# Patient Record
Sex: Female | Born: 1995 | Race: White | Hispanic: Yes | Marital: Single | Smoking: Former smoker
Health system: Southern US, Community
[De-identification: ages and names within clinical notes are randomized; demographics above are authoritative.]

## PROBLEM LIST (undated history)

## (undated) DIAGNOSIS — O24419 Gestational diabetes mellitus in pregnancy, unspecified control: Secondary | ICD-10-CM

## (undated) DIAGNOSIS — Z789 Other specified health status: Secondary | ICD-10-CM

## (undated) DIAGNOSIS — S82842A Displaced bimalleolar fracture of left lower leg, initial encounter for closed fracture: Secondary | ICD-10-CM

## (undated) HISTORY — PX: OTHER SURGICAL HISTORY: SHX169

## (undated) HISTORY — DX: Gestational diabetes mellitus in pregnancy, unspecified control: O24.419

## (undated) HISTORY — PX: ANKLE SURGERY: SHX546

---
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
## 2016-10-01 ENCOUNTER — Ambulatory Visit: Payer: Self-pay | Admitting: Sports Medicine

## 2016-10-17 ENCOUNTER — Ambulatory Visit: Payer: Self-pay | Admitting: Sports Medicine

## 2016-12-03 ENCOUNTER — Ambulatory Visit: Payer: Self-pay | Admitting: Sports Medicine

## 2016-12-11 ENCOUNTER — Ambulatory Visit: Payer: Self-pay | Admitting: Sports Medicine

## 2016-12-22 ENCOUNTER — Ambulatory Visit: Payer: Self-pay | Admitting: Podiatry

## 2017-09-04 ENCOUNTER — Encounter (HOSPITAL_COMMUNITY): Payer: Self-pay | Admitting: Emergency Medicine

## 2017-09-04 ENCOUNTER — Other Ambulatory Visit: Payer: Self-pay

## 2017-09-04 ENCOUNTER — Ambulatory Visit (HOSPITAL_COMMUNITY)
Admission: EM | Admit: 2017-09-04 | Discharge: 2017-09-05 | Disposition: A | Discharge status: Home / Self Care | Payer: Self-pay | Attending: Family Medicine | Admitting: Family Medicine

## 2017-09-04 DIAGNOSIS — O00101 Right tubal pregnancy without intrauterine pregnancy: Secondary | ICD-10-CM | POA: Insufficient documentation

## 2017-09-04 DIAGNOSIS — O26891 Other specified pregnancy related conditions, first trimester: Secondary | ICD-10-CM

## 2017-09-04 DIAGNOSIS — R109 Unspecified abdominal pain: Secondary | ICD-10-CM

## 2017-09-04 DIAGNOSIS — Z87891 Personal history of nicotine dependence: Secondary | ICD-10-CM | POA: Insufficient documentation

## 2017-09-04 HISTORY — DX: Other specified health status: Z78.9

## 2017-09-04 LAB — CBC
HCT: 38.6 % (ref 36.0–46.0)
Hemoglobin: 13 g/dL (ref 12.0–15.0)
MCH: 32.9 pg (ref 26.0–34.0)
MCHC: 33.7 g/dL (ref 30.0–36.0)
MCV: 97.7 fL (ref 78.0–100.0)
PLATELETS: 296 10*3/uL (ref 150–400)
RBC: 3.95 MIL/uL (ref 3.87–5.11)
RDW: 12.4 % (ref 11.5–15.5)
WBC: 6.7 10*3/uL (ref 4.0–10.5)

## 2017-09-04 LAB — URINALYSIS, ROUTINE W REFLEX MICROSCOPIC
Bilirubin Urine: NEGATIVE
GLUCOSE, UA: NEGATIVE mg/dL
KETONES UR: NEGATIVE mg/dL
Leukocytes, UA: NEGATIVE
Nitrite: NEGATIVE
PH: 8 (ref 5.0–8.0)
Protein, ur: NEGATIVE mg/dL
Specific Gravity, Urine: 1.014 (ref 1.005–1.030)

## 2017-09-04 LAB — COMPREHENSIVE METABOLIC PANEL
ALK PHOS: 63 U/L (ref 38–126)
ALT: 26 U/L (ref 14–54)
AST: 27 U/L (ref 15–41)
Albumin: 4.2 g/dL (ref 3.5–5.0)
Anion gap: 8 (ref 5–15)
BUN: 7 mg/dL (ref 6–20)
CALCIUM: 9 mg/dL (ref 8.9–10.3)
CHLORIDE: 105 mmol/L (ref 101–111)
CO2: 27 mmol/L (ref 22–32)
CREATININE: 0.59 mg/dL (ref 0.44–1.00)
Glucose, Bld: 107 mg/dL — ABNORMAL HIGH (ref 65–99)
Potassium: 3.3 mmol/L — ABNORMAL LOW (ref 3.5–5.1)
Sodium: 140 mmol/L (ref 135–145)
Total Bilirubin: 0.8 mg/dL (ref 0.3–1.2)
Total Protein: 6.8 g/dL (ref 6.5–8.1)

## 2017-09-04 LAB — I-STAT BETA HCG BLOOD, ED (MC, WL, AP ONLY): I-stat hCG, quantitative: 70.2 m[IU]/mL — ABNORMAL HIGH (ref <5)

## 2017-09-04 LAB — LIPASE, BLOOD: LIPASE: 27 U/L (ref 11–51)

## 2017-09-04 NOTE — ED Triage Notes (Signed)
C/o RLQ/pelvic pain/cramping since yesterday.  LMP started 5/1 and she is still spotting.  Normally last 4 days.  Mild nausea this evening.  Denies urinary complaints.

## 2017-09-05 ENCOUNTER — Encounter (HOSPITAL_COMMUNITY)
Admission: EM | Disposition: A | Discharge status: Home / Self Care | Payer: Self-pay | Source: Home / Self Care | Attending: Emergency Medicine

## 2017-09-05 ENCOUNTER — Encounter (HOSPITAL_COMMUNITY): Payer: Self-pay | Admitting: Student

## 2017-09-05 ENCOUNTER — Emergency Department (HOSPITAL_COMMUNITY): Payer: Self-pay

## 2017-09-05 ENCOUNTER — Inpatient Hospital Stay (HOSPITAL_COMMUNITY): Payer: Self-pay | Admitting: Anesthesiology

## 2017-09-05 ENCOUNTER — Inpatient Hospital Stay (HOSPITAL_COMMUNITY): Payer: Self-pay

## 2017-09-05 DIAGNOSIS — O00101 Right tubal pregnancy without intrauterine pregnancy: Secondary | ICD-10-CM | POA: Diagnosis present

## 2017-09-05 HISTORY — PX: LAPAROSCOPY: SHX197

## 2017-09-05 LAB — CBC
HEMATOCRIT: 39.9 % (ref 36.0–46.0)
HEMOGLOBIN: 13.8 g/dL (ref 12.0–15.0)
MCH: 33.8 pg (ref 26.0–34.0)
MCHC: 34.6 g/dL (ref 30.0–36.0)
MCV: 97.8 fL (ref 78.0–100.0)
Platelets: 292 10*3/uL (ref 150–400)
RBC: 4.08 MIL/uL (ref 3.87–5.11)
RDW: 12.2 % (ref 11.5–15.5)
WBC: 5.7 10*3/uL (ref 4.0–10.5)

## 2017-09-05 LAB — ABO/RH
ABO/RH(D): O POS
ABO/RH(D): O POS

## 2017-09-05 LAB — TYPE AND SCREEN
ABO/RH(D): O POS
ANTIBODY SCREEN: NEGATIVE

## 2017-09-05 LAB — WET PREP, GENITAL
SPERM: NONE SEEN
Trich, Wet Prep: NONE SEEN
YEAST WET PREP: NONE SEEN

## 2017-09-05 LAB — HCG, QUANTITATIVE, PREGNANCY
hCG, Beta Chain, Quant, S: 71 m[IU]/mL — ABNORMAL HIGH (ref <5)
hCG, Beta Chain, Quant, S: 66 m[IU]/mL — ABNORMAL HIGH (ref <5)

## 2017-09-05 SURGERY — LAPAROSCOPY OPERATIVE
Anesthesia: General | Site: Abdomen | Laterality: Right

## 2017-09-05 MED ORDER — LIDOCAINE HCL (CARDIAC) PF 100 MG/5ML IV SOSY
PREFILLED_SYRINGE | INTRAVENOUS | Status: AC
  Filled 2017-09-05: qty 5

## 2017-09-05 MED ORDER — MEPERIDINE HCL 25 MG/ML IJ SOLN
INTRAMUSCULAR | Status: AC
  Administered 2017-09-05: 12.5 mg via INTRAVENOUS
  Filled 2017-09-05: qty 1

## 2017-09-05 MED ORDER — LACTATED RINGERS IV BOLUS
1000.0000 mL | Freq: Once | INTRAVENOUS | Status: AC
Start: 2017-09-05 — End: 2017-09-05
  Administered 2017-09-05 (×2): 1000 mL via INTRAVENOUS

## 2017-09-05 MED ORDER — DEXAMETHASONE SODIUM PHOSPHATE 10 MG/ML IJ SOLN
INTRAMUSCULAR | Status: DC | PRN
Start: 2017-09-05 — End: 2017-09-05
  Administered 2017-09-05: 10 mg via INTRAVENOUS

## 2017-09-05 MED ORDER — SUCCINYLCHOLINE CHLORIDE 200 MG/10ML IV SOSY
PREFILLED_SYRINGE | INTRAVENOUS | Status: AC
  Filled 2017-09-05: qty 10

## 2017-09-05 MED ORDER — BUPIVACAINE HCL (PF) 0.25 % IJ SOLN
INTRAMUSCULAR | Status: DC | PRN
  Administered 2017-09-05: 30 mL

## 2017-09-05 MED ORDER — ROCURONIUM BROMIDE 100 MG/10ML IV SOLN
INTRAVENOUS | Status: AC
  Filled 2017-09-05: qty 1

## 2017-09-05 MED ORDER — MEPERIDINE HCL 25 MG/ML IJ SOLN
6.2500 mg | INTRAMUSCULAR | Status: DC | PRN
  Administered 2017-09-05 (×2): 12.5 mg via INTRAVENOUS

## 2017-09-05 MED ORDER — DEXAMETHASONE SODIUM PHOSPHATE 10 MG/ML IJ SOLN
INTRAMUSCULAR | Status: AC
  Filled 2017-09-05: qty 1

## 2017-09-05 MED ORDER — LACTATED RINGERS IV SOLN: INTRAVENOUS | Status: DC

## 2017-09-05 MED ORDER — SUGAMMADEX SODIUM 200 MG/2ML IV SOLN
INTRAVENOUS | Status: AC
  Filled 2017-09-05: qty 2

## 2017-09-05 MED ORDER — EPHEDRINE 5 MG/ML INJ
INTRAVENOUS | Status: AC
  Filled 2017-09-05: qty 10

## 2017-09-05 MED ORDER — PROPOFOL 10 MG/ML IV BOLUS
INTRAVENOUS | Status: AC
  Filled 2017-09-05: qty 20

## 2017-09-05 MED ORDER — KETOROLAC TROMETHAMINE 30 MG/ML IJ SOLN
INTRAMUSCULAR | Status: AC
  Filled 2017-09-05: qty 1

## 2017-09-05 MED ORDER — MIDAZOLAM HCL 5 MG/5ML IJ SOLN
INTRAMUSCULAR | Status: DC | PRN
  Administered 2017-09-05: 2 mg via INTRAVENOUS

## 2017-09-05 MED ORDER — ONDANSETRON HCL 4 MG/2ML IJ SOLN
INTRAMUSCULAR | Status: AC
  Filled 2017-09-05: qty 2

## 2017-09-05 MED ORDER — ROCURONIUM BROMIDE 10 MG/ML (PF) SYRINGE
PREFILLED_SYRINGE | INTRAVENOUS | Status: DC | PRN
  Administered 2017-09-05: 80 mg via INTRAVENOUS

## 2017-09-05 MED ORDER — SODIUM CHLORIDE 0.9 % IR SOLN
Status: DC | PRN
  Administered 2017-09-05: 3000 mL

## 2017-09-05 MED ORDER — OXYCODONE-ACETAMINOPHEN 5-325 MG PO TABS: 1.0000 | ORAL_TABLET | Freq: Four times a day (QID) | ORAL | 0 refills | Status: DC | PRN

## 2017-09-05 MED ORDER — LIDOCAINE 2% (20 MG/ML) 5 ML SYRINGE
INTRAMUSCULAR | Status: DC | PRN
  Administered 2017-09-05: 100 mg via INTRAVENOUS

## 2017-09-05 MED ORDER — FENTANYL CITRATE (PF) 100 MCG/2ML IJ SOLN
25.0000 ug | INTRAMUSCULAR | Status: DC | PRN
  Administered 2017-09-05: 50 ug via INTRAVENOUS

## 2017-09-05 MED ORDER — PROPOFOL 10 MG/ML IV BOLUS
INTRAVENOUS | Status: DC | PRN
  Administered 2017-09-05: 160 mg via INTRAVENOUS

## 2017-09-05 MED ORDER — METOCLOPRAMIDE HCL 5 MG/ML IJ SOLN: 10.0000 mg | Freq: Once | INTRAMUSCULAR | Status: DC | PRN

## 2017-09-05 MED ORDER — PROPOFOL 10 MG/ML IV BOLUS
INTRAVENOUS | Status: AC
  Filled 2017-09-05: qty 40

## 2017-09-05 MED ORDER — OXYCODONE HCL 5 MG PO TABS: 5.0000 mg | ORAL_TABLET | Freq: Four times a day (QID) | ORAL | 0 refills | Status: DC | PRN

## 2017-09-05 MED ORDER — FENTANYL CITRATE (PF) 100 MCG/2ML IJ SOLN
INTRAMUSCULAR | Status: AC
  Administered 2017-09-05: 50 ug via INTRAVENOUS
  Filled 2017-09-05: qty 2

## 2017-09-05 MED ORDER — BUPIVACAINE HCL (PF) 0.25 % IJ SOLN
INTRAMUSCULAR | Status: AC
  Filled 2017-09-05: qty 30

## 2017-09-05 MED ORDER — ONDANSETRON HCL 4 MG/2ML IJ SOLN
INTRAMUSCULAR | Status: DC | PRN
  Administered 2017-09-05: 4 mg via INTRAVENOUS

## 2017-09-05 MED ORDER — KETOROLAC TROMETHAMINE 30 MG/ML IJ SOLN
INTRAMUSCULAR | Status: DC | PRN
  Administered 2017-09-05: 30 mg via INTRAVENOUS

## 2017-09-05 MED ORDER — LACTATED RINGERS IV SOLN
INTRAVENOUS | Status: DC | PRN
  Administered 2017-09-05: 18:00:00 via INTRAVENOUS

## 2017-09-05 MED ORDER — FENTANYL CITRATE (PF) 250 MCG/5ML IJ SOLN
INTRAMUSCULAR | Status: AC
  Filled 2017-09-05: qty 5

## 2017-09-05 MED ORDER — MIDAZOLAM HCL 2 MG/2ML IJ SOLN
INTRAMUSCULAR | Status: AC
  Filled 2017-09-05: qty 2

## 2017-09-05 MED ORDER — FENTANYL CITRATE (PF) 250 MCG/5ML IJ SOLN
INTRAMUSCULAR | Status: DC | PRN
  Administered 2017-09-05: 100 ug via INTRAVENOUS
  Administered 2017-09-05 (×2): 50 ug via INTRAVENOUS

## 2017-09-05 MED ORDER — SUGAMMADEX SODIUM 200 MG/2ML IV SOLN
INTRAVENOUS | Status: DC | PRN
  Administered 2017-09-05: 200 mg via INTRAVENOUS

## 2017-09-05 SURGICAL SUPPLY — 23 items
BLADE SURG 15 STRL LF C SS BP (BLADE) IMPLANT
BLADE SURG 15 STRL SS (BLADE)
DRSG OPSITE POSTOP 3X4 (GAUZE/BANDAGES/DRESSINGS) ×3 IMPLANT
DURAPREP 26ML APPLICATOR (WOUND CARE) ×3 IMPLANT
GLOVE BIOGEL PI IND STRL 7.0 (GLOVE) ×4 IMPLANT
GLOVE BIOGEL PI INDICATOR 7.0 (GLOVE) ×8
GLOVE ECLIPSE 7.0 STRL STRAW (GLOVE) ×3 IMPLANT
GOWN STRL REUS W/TWL LRG LVL3 (GOWN DISPOSABLE) ×9 IMPLANT
PACK LAPAROSCOPY BASIN (CUSTOM PROCEDURE TRAY) ×3 IMPLANT
PACK TRENDGUARD 450 HYBRID PRO (MISCELLANEOUS) IMPLANT
POUCH SPECIMEN RETRIEVAL 10MM (ENDOMECHANICALS) ×3 IMPLANT
PROTECTOR NERVE ULNAR (MISCELLANEOUS) ×6 IMPLANT
SET IRRIG TUBING LAPAROSCOPIC (IRRIGATION / IRRIGATOR) ×3 IMPLANT
SHEARS HARMONIC ACE PLUS 36CM (ENDOMECHANICALS) ×3 IMPLANT
SLEEVE XCEL OPT CAN 5 100 (ENDOMECHANICALS) ×3 IMPLANT
SUT VIC AB 3-0 X1 27 (SUTURE) ×3 IMPLANT
SUT VICRYL 0 UR6 27IN ABS (SUTURE) ×3 IMPLANT
TOWEL OR 17X24 6PK STRL BLUE (TOWEL DISPOSABLE) ×6 IMPLANT
TRAY FOLEY W/BAG SLVR 14FR (SET/KITS/TRAYS/PACK) ×3 IMPLANT
TRENDGUARD 450 HYBRID PRO PACK (MISCELLANEOUS)
TROCAR BALLN 12MMX100 BLUNT (TROCAR) ×3 IMPLANT
TROCAR XCEL NON-BLD 5MMX100MML (ENDOMECHANICALS) ×3 IMPLANT
WARMER LAPAROSCOPE (MISCELLANEOUS) ×3 IMPLANT

## 2017-09-05 NOTE — MAU Provider Note (Signed)
History     CSN: [id number]  Arrival date and time: 09/04/17 2132   First Provider Initiated Contact with Patient 09/05/17 1246      Chief Complaint  Patient presents with  . Abdominal Pain   HPI Leah Gray is a 22 y.o. G1P0 at [redacted]w[redacted]d who presents from [facility] for evaluation of possible ectopic pregnancy. This is her first pregnancy. Presented to the ED last night for RLQ pain. Pain started early Friday morning. Pain initially rated 9/10 & described as intermittent cramping. Currently denies any pain. Denies n/v/d, dysuria, vaginal discharge, or vaginal bleeding.   OB History    Gravida  1   Para      Term      Preterm      AB      Living        SAB      TAB      Ectopic      Multiple      Live Births              Past Medical History:  Diagnosis Date  . Medical history non-contributory     Past Surgical History:  Procedure Laterality Date  . ANKLE FRACTURE SURGERY  2014   bilateral   . ANKLE SURGERY      Family History  Problem Relation Age of Onset  . Asthma Mother   . Cancer Mother   . Miscarriages / Stillbirths Maternal Aunt   . COPD Maternal Grandmother   . Anxiety disorder Maternal Grandfather   . Depression Maternal Grandfather   . Diabetes Maternal Grandfather     Social History   Tobacco Use  . Smoking status: Former Games developer  . Smokeless tobacco: Never Used  Substance Use Topics  . Alcohol use: Yes    Comment: social   . Drug use: Never    Allergies: No Known Allergies  No medications prior to admission.    Review of Systems  Constitutional: Negative.   Gastrointestinal: Positive for abdominal pain.  Genitourinary: Negative.    Physical Exam   Blood pressure 118/77, pulse 66, temperature 98.8 F (37.1 C), temperature source Oral, resp. rate 16, height  (1.549 m), weight 140 lb (63.5 kg), last menstrual period 08/26/2017, SpO2 100 %.  Physical Exam  Nursing note and vitals reviewed. Constitutional: She is  oriented to person, place, and time. She appears well-developed and well-nourished. No distress.  HENT:  Head: Normocephalic and atraumatic.  Eyes: Conjunctivae are normal. Right eye exhibits no discharge. Left eye exhibits no discharge. No scleral icterus.  Neck: Normal range of motion.  Cardiovascular: Normal rate, regular rhythm and normal heart sounds.  No murmur heard. Respiratory: Effort normal and breath sounds normal. No respiratory distress. She has no wheezes.  GI: Soft. Bowel sounds are normal. She exhibits no distension. There is no tenderness. There is no rebound and no guarding.  Neurological: She is alert and oriented to person, place, and time.  Skin: Skin is warm and dry. She is not diaphoretic.  Psychiatric: She has a normal mood and affect. Her behavior is normal. Judgment and thought content normal.    MAU Course  Procedures Results for orders placed or performed during the hospital encounter of 09/04/17 (from the past 24 hour(s))  Urinalysis, Routine w reflex microscopic     Status: Abnormal   Collection Time: 09/04/17 10:20 PM  Result Value Ref Range   Color, Urine YELLOW YELLOW   APPearance HAZY (A) CLEAR  Specific Gravity, Urine 1.014 1.005 - 1.030   pH 8.0 5.0 - 8.0   Glucose, UA NEGATIVE NEGATIVE mg/dL   Hgb urine dipstick LARGE (A) NEGATIVE   Bilirubin Urine NEGATIVE NEGATIVE   Ketones, ur NEGATIVE NEGATIVE mg/dL   Protein, ur NEGATIVE NEGATIVE mg/dL   Nitrite NEGATIVE NEGATIVE   Leukocytes, UA NEGATIVE NEGATIVE   RBC / HPF 11-20 0 - 5 RBC/hpf   WBC, UA 0-5 0 - 5 WBC/hpf   Bacteria, UA FEW (A) NONE SEEN   Squamous Epithelial / LPF 0-5 0 - 5  Lipase, blood     Status: None   Collection Time: 09/04/17 10:27 PM  Result Value Ref Range   Lipase 27 11 - 51 U/L  Comprehensive metabolic panel     Status: Abnormal   Collection Time: 09/04/17 10:27 PM  Result Value Ref Range   Sodium 140 135 - 145 mmol/L   Potassium 3.3 (L) 3.5 - 5.1 mmol/L   Chloride  105 101 - 111 mmol/L   CO2 27 22 - 32 mmol/L   Glucose, Bld 107 (H) 65 - 99 mg/dL   BUN 7 6 - 20 mg/dL   Creatinine, Ser 5.40 0.44 - 1.00 mg/dL   Calcium 9.0 8.9 - 98.1 mg/dL   Total Protein 6.8 6.5 - 8.1 g/dL   Albumin 4.2 3.5 - 5.0 g/dL   AST 27 15 - 41 U/L   ALT 26 14 - 54 U/L   Alkaline Phosphatase 63 38 - 126 U/L   Total Bilirubin 0.8 0.3 - 1.2 mg/dL   GFR calc non Af Amer >60 >60 mL/min   GFR calc Af Amer >60 >60 mL/min   Anion gap 8 5 - 15  CBC     Status: None   Collection Time: 09/04/17 10:27 PM  Result Value Ref Range   WBC 6.7 4.0 - 10.5 K/uL   RBC 3.95 3.87 - 5.11 MIL/uL   Hemoglobin 13.0 12.0 - 15.0 g/dL   HCT 19.1 47.8 - 29.5 %   MCV 97.7 78.0 - 100.0 fL   MCH 32.9 26.0 - 34.0 pg   MCHC 33.7 30.0 - 36.0 g/dL   RDW 62.1 30.8 - 65.7 %   Platelets 296 150 - 400 K/uL  I-Stat beta hCG blood, ED     Status: Abnormal   Collection Time: 09/04/17 10:48 PM  Result Value Ref Range   I-stat hCG, quantitative 70.2 (H) <5 mIU/mL   Comment 3          Wet prep, genital     Status: Abnormal   Collection Time: 09/05/17  7:00 AM  Result Value Ref Range   Yeast Wet Prep HPF POC NONE SEEN NONE SEEN   Trich, Wet Prep NONE SEEN NONE SEEN   Clue Cells Wet Prep HPF POC PRESENT (A) NONE SEEN   WBC, Wet Prep HPF POC FEW (A) NONE SEEN   Sperm NONE SEEN   hCG, quantitative, pregnancy     Status: Abnormal   Collection Time: 09/05/17  7:25 AM  Result Value Ref Range   hCG, Beta Chain, Quant, S 71 (H) <5 mIU/mL  ABO/Rh     Status: None   Collection Time: 09/05/17  7:29 AM  Result Value Ref Range   ABO/RH(D) O POS    No rh immune globuloin      NOT A RH IMMUNE GLOBULIN CANDIDATE, PT RH POSITIVE Performed at [facility], [address], [place], [place] [address]  CBC     Status: None   Collection Time: 09/05/17  1:08 PM  Result Value Ref Range   WBC 5.7 4.0 - 10.5 K/uL   RBC 4.08 3.87 - 5.11 MIL/uL   Hemoglobin 13.8 12.0 - 15.0 g/dL   HCT 16.1 09.6 - 04.5 %   MCV 97.8  78.0 - 100.0 fL   MCH 33.8 26.0 - 34.0 pg   MCHC 34.6 30.0 - 36.0 g/dL   RDW 40.9 81.1 - 91.4 %   Platelets 292 150 - 400 K/uL  hCG, quantitative, pregnancy     Status: Abnormal   Collection Time: 09/05/17  1:08 PM  Result Value Ref Range   hCG, Beta Chain, Quant, S 66 (H) <5 mIU/mL  Type and screen     Status: None (Preliminary result)   Collection Time: 09/05/17  1:16 PM  Result Value Ref Range   ABO/RH(D) O POS    Antibody Screen PENDING    Sample Expiration      09/08/2017 Performed at [facility], [address], [place], [place] [address]    US Ob Transvaginal  Result Date: 09/05/2017 CLINICAL DATA:  Pain in first trimester of pregnancy, prior abnormal ultrasound with RIGHT ectopic pregnancy EXAM: TRANSVAGINAL OB ULTRASOUND TECHNIQUE: Transvaginal ultrasound was performed for complete evaluation of the gestation as well as the maternal uterus, adnexal regions, and pelvic cul-de-sac. COMPARISON:  Earlier study of 09/05/2017 FINDINGS: Intrauterine gestational sac: Absent Yolk sac:  N/A Embryo:  N/A Cardiac Activity: N/A Heart Rate: N/A bpm MSD:   mm    w     d CRL:     mm    w  d                  [place] EDC: Subchorionic hemorrhage:  N/A Maternal uterus/adnexae: Uterus normal appearance. No uterine mass, gestational sac, or endometrial fluid. Moderate complicated free pelvic fluid question blood. Ovaries remain normal in size and morphology. Adjacent to the RIGHT ovary but appearing separate is a small rounded heterogeneous nodule measuring 2.3 x 1.7 x 2.0 cm. Nodule demonstrates hypervascularity on color Doppler imaging. Findings remain most consistent with a RIGHT adnexal ectopic pregnancy. IMPRESSION: No intrauterine gestational sac identified. RIGHT adnexal mass 2.3 cm greatest diameter with associated moderate complex fluid/blood in pelvis consistent with a ruptured RIGHT adnexal ectopic pregnancy. Critical Value/emergent results were called by telephone at the time of interpretation  on 09/05/2017 at 1:55 pm to Judeth Horn NP, who verbally acknowledged these results. Electronically Signed   By: Ulyses Southward M.D.   On: 09/05/2017 13:56    MDM Labs & ultrasound repeated here in MAU. Ultrasound shows 2.3 cm right adnexal mass with moderate complex fluid consistent with ruptured ectopic.   Dr. Shawnie Pons notified of results and will come see patient & discuss plan of care.   Assessment and Plan  A:  1. Right tubal pregnancy without intrauterine pregnancy   2. Abdominal pain during pregnancy in first trimester    P: Dr. Shawnie Pons at bedside  Judeth Horn 09/05/2017, 12:46 PM

## 2017-09-05 NOTE — Anesthesia Procedure Notes (Signed)
Procedure Name: Intubation Date/Time: 09/05/2017 5:46 PM Performed by: Jhonnie Garner, CRNA Pre-anesthesia Checklist: Patient identified, Emergency Drugs available, Suction available and Patient being monitored Patient Re-evaluated:Patient Re-evaluated prior to induction Oxygen Delivery Method: Circle system utilized Preoxygenation: Pre-oxygenation with 100% oxygen Induction Type: IV induction Ventilation: Mask ventilation without difficulty Laryngoscope Size: Miller and 2 Grade View: Grade I Tube type: Oral Tube size: 7.0 mm Number of attempts: 1 Airway Equipment and Method: Stylet Placement Confirmation: ETT inserted through vocal cords under direct vision,  positive ETCO2 and breath sounds checked- equal and bilateral Secured at: 21 cm Tube secured with: Tape Dental Injury: Teeth and Oropharynx as per pre-operative assessment

## 2017-09-05 NOTE — Op Note (Signed)
PREOPERATIVE DIAGNOSIS: Probable ruptured ectopic pregnancy  POSTOPERATIVE DIAGNOSIS: Same  PROCEDURE: Laparoscopic right salpingectomy   INDICATIONS: 22 y.o. G1P0 at [redacted]w[redacted]d here for with ruptured ectopic pregnancy with blood type O pos. Patient was counseled regarding need for laparoscopic salpingectomy. Risks of surgery including bleeding which may require transfusion or reoperation, infection, injury to bowel or other surrounding organs, need for additional procedures including laparotomy and other postoperative/anesthesia complications were explained to patient.  Written informed consent was obtained  FINDINGS: minimal amount of hemoperitoneum estimated to be about 100 cc of blood and clots.  Dilated right fallopian tube containing ectopic gestation. Small normal appearing uterus, normal right fallopian tube, right ovary and right ovary.  ANESTHESIA: General  SPECIMENS: right fallopian tube to pathology  COMPLICATIONS: None immediate  PROCEDURE IN DETAIL:  The patient was taken to the operating room where general anesthesia was administered and was found to be adequate.  She was placed in the dorsal lithotomy position, and was prepped and draped in a sterile manner.  A Foley catheter was inserted into her bladder and attached to constant drainage and a uterine manipulator was then advanced into the uterus .  After an adequate timeout was performed, attention was then turned to the patient's abdomen where a 10-mm skin incision was made in the umbilical fold. Fascia and peritoneum were entered sharply.  A 0 Vicryl suture was used to tag the fascia circumferentially.  A Hassan trocar was placed. The laparoscope was introduced.  A survey of the patient's pelvis and abdomen revealed the findings as above.  Two left lower quadrant ports were placed under direct visualization; 5-mm x 2.  Attention was then turned to the right fallopian tube which was grasped and ligated from the underlying mesosalpinx and  uterine attachment using the Harmonic instrument.  Good hemostasis was noted.  The specimen was placed in an EndoCatch bag and removed from the abdomen intact. Clot and blood removed with the Nezjhat. The abdomen was desufflated, and all instruments were removed.  The umbilicus incision was closed with the afore mentioned Vicryl suture; and all skin incisions were closed with a 3-0 Vicryl subcuticular stitch followed by DermaBond. The patient tolerated the procedure well.  All instruments, needles, and sponge counts were correct x 2. The patient was taken to the recovery room in stable condition.   Reva Bores MD 09/05/2017 7:00 PM

## 2017-09-05 NOTE — ED Provider Notes (Addendum)
[facility] EMERGENCY DEPARTMENT Provider Note   CSN: [id number] Arrival date & time: 09/04/17  2132     History   Chief Complaint Chief Complaint  Patient presents with  . Abdominal Pain    HPI Leah Gray is a 22 y.o. female without significant past medical hx who presents to the ED with complaint of RLQ/R sided pelvic pain that started yesterday around 0200 (approx. 30 hours ago). Patient states she started her menstrual cycle 05/01 during which she had generalized intermittent pelvic cramps that she has with her typical periods. She states that she had what was fairly normal bleeding for her for a few days that had then eased off, but never completely stopped. Two days ago the bleeding started to increase again requiring approximately 5 tampons per day. She states she developed the R lower pelvic discomfort fairly suddenly at 0200 in the AM yesterday, the pain has been constant since, but has significant eased off. She rates her current discomfort a 3/10 in severity. No specific alleviating/aggravating factors. She had some associated nausea without vomiting. States she had some dyspareunia 2 days ago as well. Denies fever, chills, vomiting, diarrhea, blood in stool, dysuria, or vaginal discharge. She is sexually active in a monogamous relationship without any form of protection. She is G0P0A0.    HPI  History reviewed. No pertinent past medical history.  There are no active problems to display for this patient.   Past Surgical History:  Procedure Laterality Date  . ANKLE SURGERY       OB History   None      Home Medications    Prior to Admission medications   Not on File    Family History History reviewed. No pertinent family history.  Social History Social History   Tobacco Use  . Smoking status: Former Games developer  . Smokeless tobacco: Never Used  Substance Use Topics  . Alcohol use: Yes  . Drug use: Never     Allergies   Patient has  no allergy information on record.   Review of Systems Review of Systems  Constitutional: Negative for chills and fever.  Respiratory: Negative for shortness of breath.   Cardiovascular: Negative for chest pain.  Gastrointestinal: Positive for abdominal pain and nausea. Negative for blood in stool, diarrhea and vomiting.  Genitourinary: Positive for dyspareunia, pelvic pain and vaginal bleeding. Negative for dysuria, frequency, urgency and vaginal discharge.  Neurological: Negative for dizziness, syncope, weakness and light-headedness.  All other systems reviewed and are negative.    Physical Exam Updated Vital Signs BP 121/79 (BP Location: Left Arm)   Pulse 62   Temp 98.8 F (37.1 C) (Oral)   Resp 18   Ht  (1.549 m)   Wt 63.5 kg (140 lb)   LMP 08/26/2017   SpO2 100%   BMI 26.45 kg/m   Physical Exam  Constitutional: She appears well-developed and well-nourished. No distress.  HENT:  Head: Normocephalic and atraumatic.  Eyes: Conjunctivae are normal. Right eye exhibits no discharge. Left eye exhibits no discharge.  Cardiovascular: Normal rate and regular rhythm.  No murmur heard. Pulmonary/Chest: Breath sounds normal. No respiratory distress. She has no wheezes. She has no rales.  Abdominal: Soft. She exhibits no distension. There is tenderness (Right suprapbuic/RLQ). There is no rigidity, no rebound and no guarding.  Genitourinary: Pelvic exam was performed with patient supine. There is no lesion on the right labia. There is no lesion on the left labia. Cervix exhibits no motion tenderness,  no discharge and no friability. Right adnexum displays tenderness. Right adnexum displays no mass. Left adnexum displays no mass and no tenderness. There is bleeding (mild amount of blood which appears to be coming from cervical os. cervical os is closed) in the vagina. No signs of injury around the vagina. No vaginal discharge found.  Genitourinary Comments: Signa Kell RN present as  chaperone  Neurological: She is alert.  Clear speech.   Skin: Skin is warm and dry. No rash noted.  Psychiatric: She has a normal mood and affect. Her behavior is normal.  Nursing note and vitals reviewed.   ED Treatments / Results  Labs Results for orders placed or performed during the hospital encounter of 09/04/17  Wet prep, genital  Result Value Ref Range   Yeast Wet Prep HPF POC NONE SEEN NONE SEEN   Trich, Wet Prep NONE SEEN NONE SEEN   Clue Cells Wet Prep HPF POC PRESENT (A) NONE SEEN   WBC, Wet Prep HPF POC FEW (A) NONE SEEN   Sperm NONE SEEN   Lipase, blood  Result Value Ref Range   Lipase 27 11 - 51 U/L  Comprehensive metabolic panel  Result Value Ref Range   Sodium 140 135 - 145 mmol/L   Potassium 3.3 (L) 3.5 - 5.1 mmol/L   Chloride 105 101 - 111 mmol/L   CO2 27 22 - 32 mmol/L   Glucose, Bld 107 (H) 65 - 99 mg/dL   BUN 7 6 - 20 mg/dL   Creatinine, Ser 1.61 0.44 - 1.00 mg/dL   Calcium 9.0 8.9 - 09.6 mg/dL   Total Protein 6.8 6.5 - 8.1 g/dL   Albumin 4.2 3.5 - 5.0 g/dL   AST 27 15 - 41 U/L   ALT 26 14 - 54 U/L   Alkaline Phosphatase 63 38 - 126 U/L   Total Bilirubin 0.8 0.3 - 1.2 mg/dL   GFR calc non Af Amer >60 >60 mL/min   GFR calc Af Amer >60 >60 mL/min   Anion gap 8 5 - 15  CBC  Result Value Ref Range   WBC 6.7 4.0 - 10.5 K/uL   RBC 3.95 3.87 - 5.11 MIL/uL   Hemoglobin 13.0 12.0 - 15.0 g/dL   HCT 04.5 40.9 - 81.1 %   MCV 97.7 78.0 - 100.0 fL   MCH 32.9 26.0 - 34.0 pg   MCHC 33.7 30.0 - 36.0 g/dL   RDW 91.4 78.2 - 95.6 %   Platelets 296 150 - 400 K/uL  Urinalysis, Routine w reflex microscopic  Result Value Ref Range   Color, Urine YELLOW YELLOW   APPearance HAZY (A) CLEAR   Specific Gravity, Urine 1.014 1.005 - 1.030   pH 8.0 5.0 - 8.0   Glucose, UA NEGATIVE NEGATIVE mg/dL   Hgb urine dipstick LARGE (A) NEGATIVE   Bilirubin Urine NEGATIVE NEGATIVE   Ketones, ur NEGATIVE NEGATIVE mg/dL   Protein, ur NEGATIVE NEGATIVE mg/dL   Nitrite NEGATIVE  NEGATIVE   Leukocytes, UA NEGATIVE NEGATIVE   RBC / HPF 11-20 0 - 5 RBC/hpf   WBC, UA 0-5 0 - 5 WBC/hpf   Bacteria, UA FEW (A) NONE SEEN   Squamous Epithelial / LPF 0-5 0 - 5  hCG, quantitative, pregnancy  Result Value Ref Range   hCG, Beta Chain, Quant, S 71 (H) <5 mIU/mL  I-Stat beta hCG blood, ED  Result Value Ref Range   I-stat hCG, quantitative 70.2 (H) <5 mIU/mL   Comment 3  ABO/Rh  Result Value Ref Range   ABO/RH(D) O POS    No rh immune globuloin      NOT A RH IMMUNE GLOBULIN CANDIDATE, PT RH POSITIVE Performed at [facility], [address], [place], [place] [address]    EKG None  Radiology US Ob Transvaginal  Result Date: 09/05/2017 CLINICAL DATA:  RIGHT pelvic pain and vaginal bleeding for 10 days, no quantitative beta HCG available for correlation at this time EXAM: OBSTETRIC <14 WK [place] AND TRANSVAGINAL OB US TECHNIQUE: Both transabdominal and transvaginal ultrasound examinations were performed for complete evaluation of the gestation as well as the maternal uterus, adnexal regions, and pelvic cul-de-sac. Transvaginal technique was performed to assess early pregnancy. COMPARISON:  None FINDINGS: Intrauterine gestational sac: None Yolk sac:  N/A Embryo:  N/A Cardiac Activity: N/A Heart Rate: N/A  bpm MSD:   mm    w     d CRL:    mm    w    d                  [place] EDC: Subchorionic hemorrhage:  N/A Maternal uterus/adnexae: Uterus normal appearance with unremarkable endometrial complex. No uterine mass, gestational sac or focal sonographic abnormality. No endometrial fluid. RIGHT ovary normal size and morphology 3.3 x 3.9 x 2.1 cm. LEFT ovary normal size and morphology, 2.2 x 1.1 x 1.8 cm. Moderate free pelvic fluid, appears complicated question blood RIGHT adnexal mass adjacent to but appearing to be separate from RIGHT ovary, measuring 2.0 x 1.5 x 1.7 cm in size, demonstrating increased surrounding blood flow on color Doppler imaging question ectopic pregnancy.  Patient is tender within the RIGHT adnexa with imaging. IMPRESSION: No intrauterine gestation identified. RIGHT adnexal mass 2.0 x 1.5 x 1.7 cm adjacent to and appearing separate from RIGHT ovary associated with peripheral hypervascularity on color Doppler imaging and moderate complicated free pelvic fluid/blood. Findings are highly suspicious for a RIGHT adnexal ectopic pregnancy. Critical Value/emergent results were called by telephone at the time of interpretation on 09/05/2017 at 0913 hr to [facility] PA, who verbally acknowledged these results. Electronically Signed   By: Ulyses Southward M.D.   On: 09/05/2017 09:14    Procedures Procedures (including critical care time)  CRITICAL CARE Performed by: Harvie Heck   Total critical care time: 35 minutes for ectopic pregnancy requiring transfer and subsequent surgical procedure   Critical care time was exclusive of separately billable procedures and treating other patients.  Critical care was necessary to treat or prevent imminent or life-threatening deterioration.  Critical care was time spent personally by me on the following activities: development of treatment plan with patient and/or surrogate as well as nursing, discussions with consultants, evaluation of patient's response to treatment, examination of patient, obtaining history from patient or surrogate, ordering and performing treatments and interventions, ordering and review of laboratory studies, ordering and review of radiographic studies, pulse oximetry and re-evaluation of patient's condition. Medications Ordered in ED Medications - No data to display   Initial Impression / Assessment and Plan / ED Course  I have reviewed the triage vital signs and the nursing notes.  Pertinent labs & imaging results that were available during my care of the patient were reviewed by me and considered in my medical decision making (see chart for details).  Patient presents with RLQ/R  pelvic pain x 2 days with vaginal bleeding. Patient is nontoxic appearing, in no apparent distress, vitals WNL. On exam patient has R  suprapubic/RLQ tenderness without peritoneal signs. Pelvic exam performed with R adnexal tenderness, vaginal bleeding from cervical os which is closed.   DDX: Ectopic pregnancy, IUP, ovarian cyst, appendicitis, UTI/pyelonephritis.   Lab work per triage reviewed: notable for istat beta hCG elevation at 70.2. Mild hypokalemia at 3.3, mild hyperglycemia at 107. UA without obvious infection, findings consistent with patient reported vaginal bleeding.   Given RLQ/R pelvic pain with vaginal bleeding in the setting of elevated hCG ectopic pregnancy is of utmost concern at this time. Patient currently hemodynamically stable. Will obtain quantitative hCG, ABO/Rh, and Transvaginal/pelvic US.   09:10: CONSULT: Discussed case with radiologist Dr. Tyron Russell patient with right adnexal ectopic pregnancy. Consult placed to obgyn.   09:15: RE-EVAL: Patient resting comfortably, she has been updated of all results and plan for consult to obgyn.   09:33: CONSULT: Discussed with Dr. Shawnie Pons who requests the patient be transferred to [facility] for evaluation.   Findings and plan of care discussed with supervising physician Dr. Corlis Leak who is in agreement with plan.   Final Clinical Impressions(s) / ED Diagnoses   Final diagnoses:  Ectopic pregnancy without intrauterine pregnancy, unspecified location    ED Discharge Orders    None       Cherly Anderson, PA-C 09/05/17 1255    Mackuen, Cindee Salt, MD 09/06/17 [phone]  Addendum for critical care     Cherly Anderson, PA-C 09/07/17 1626    Mackuen, Cindee Salt, MD 09/09/17 2328

## 2017-09-05 NOTE — Progress Notes (Addendum)
G1 @ 1.[redacted] wksga. AOW via EMS from another facility for r/o ectopic.   Provider at bs assessing.   Lab at bs  U/S paged  U/S returned page.  1332: Pt to U/S via ambulatory   1400: back from U/S  MD at bs assessing and discussing POC.   1550: updated pt regards to MD unavailable at this time. VSS see flow sheet. Instructed pt to remove all jewelry and change remove home clothing and put gown provided in anticipation of pending surgery since pt voiced that that is what she desires.  1630: pt updated regards to MD not available at this time.   1655: Dr. Shawnie Pons at bs consenting pt for surgery and going over the risks and benefits of surgery.   House coverage notified regards to surgery. Anesthesia notified of pending surgery for ectopic pregnancy  IV site flushed with NS and LR bolus infusion started.   1715: anesthesia at bs and consent signed.   1732: pt to OR via bed.

## 2017-09-05 NOTE — H&P (Signed)
Leah Gray is an 22 y.o. G1P0 female.   Chief Complaint: RLQ pain and vaginal bleeding HPI: Presented to [facility] with significant RLQ pain. Noted to have bleeding x 10 days. BHCG was positive and u/s is indicative of ruptured right sided ectopic pregnancy with free complex fluid in the pelvis.  Past Medical History:  Diagnosis Date  . Medical history non-contributory     Past Surgical History:  Procedure Laterality Date  . ANKLE FRACTURE SURGERY  2014   bilateral   . ANKLE SURGERY      Family History  Problem Relation Age of Onset  . Asthma Mother   . Cancer Mother   . Miscarriages / Stillbirths Maternal Aunt   . COPD Maternal Grandmother   . Anxiety disorder Maternal Grandfather   . Depression Maternal Grandfather   . Diabetes Maternal Grandfather    Social History:  reports that she has quit smoking. She has never used smokeless tobacco. She reports that she drinks alcohol. She reports that she does not use drugs.  Allergies: No Known Allergies  No medications prior to admission.    Pertinent items are noted in HPI. and otherwise negative.  Blood pressure 119/74, pulse 61, temperature 98.4 F (36.9 C), temperature source Oral, resp. rate 16, height  (1.549 m), weight 140 lb (63.5 kg), last menstrual period 08/26/2017, SpO2 100 %. BP 119/74   Pulse 61   Temp 98.4 F (36.9 C) (Oral)   Resp 16   Ht  (1.549 m)   Wt 140 lb (63.5 kg)   LMP 08/26/2017   SpO2 100%   BMI 26.45 kg/m  General appearance: alert, cooperative and appears stated age Head: Normocephalic, without obvious abnormality, atraumatic Neck: supple, symmetrical, trachea midline Lungs: normal effort Heart: regular rate and rhythm Abdomen: soft, tender to palpation in RLQ without rebound Extremities: Homans sign is negative, no sign of DVT Skin: Skin color, texture, turgor normal. No rashes or lesions Neurologic: Grossly normal   Lab Results  Component Value Date   WBC 5.7 09/05/2017   HGB 13.8 09/05/2017   HCT 39.9 09/05/2017   MCV 97.8 09/05/2017   PLT 292 09/05/2017   Lab Results  Component Value Date   HCG 70.2 (H) 09/04/2017     Assessment/Plan Active Problems:   Right tubal pregnancy without intrauterine pregnancy  For laparoscopic removal. Possible salpingectomy Risks include but are not limited to bleeding, infection, injury to surrounding structures, including bowel, bladder and ureters, blood clots, and death.  Likelihood of success is high.    Leah Gray 09/05/2017, 5:10 PM

## 2017-09-05 NOTE — Anesthesia Preprocedure Evaluation (Signed)
Anesthesia Evaluation  Patient identified by MRN, date of birth, ID band Patient awake    Reviewed: Allergy & Precautions, NPO status , Patient's Chart, lab work & pertinent test results  Airway Mallampati: II  TM Distance: >3 FB Neck ROM: Full    Dental no notable dental hx.    Pulmonary neg pulmonary ROS, former smoker,    Pulmonary exam normal breath sounds clear to auscultation       Cardiovascular negative cardio ROS Normal cardiovascular exam Rhythm:Regular Rate:Normal     Neuro/Psych negative neurological ROS  negative psych ROS   GI/Hepatic negative GI ROS, Neg liver ROS,   Endo/Other  negative endocrine ROS  Renal/GU negative Renal ROS  negative genitourinary   Musculoskeletal negative musculoskeletal ROS (+)   Abdominal   Peds negative pediatric ROS (+)  Hematology negative hematology ROS (+)   Anesthesia Other Findings   Reproductive/Obstetrics (+) Pregnancy                             Anesthesia Physical Anesthesia Plan  ASA: II and emergent  Anesthesia Plan: General   Post-op Pain Management:    Induction: Intravenous  PONV Risk Score and Plan: 3 and Ondansetron and Treatment may vary due to age or medical condition  Airway Management Planned: Oral ETT  Additional Equipment:   Intra-op Plan:   Post-operative Plan: Extubation in OR  Informed Consent: I have reviewed the patients History and Physical, chart, labs and discussed the procedure including the risks, benefits and alternatives for the proposed anesthesia with the patient or authorized representative who has indicated his/her understanding and acceptance.   Dental advisory given  Plan Discussed with: CRNA  Anesthesia Plan Comments:         Anesthesia Quick Evaluation

## 2017-09-05 NOTE — Transfer of Care (Signed)
Immediate Anesthesia Transfer of Care Note  Patient: Leah Gray  Procedure(s) Performed: LAPAROSCOPY OPERATIVE (Right Abdomen)  Patient Location: PACU  Anesthesia Type:General  Level of Consciousness: sedated  Airway & Oxygen Therapy: Patient Spontanous Breathing and Patient connected to nasal cannula oxygen  Post-op Assessment: Report given to RN and Post -op Vital signs reviewed and stable  Post vital signs: Reviewed and stable  Last Vitals:  Vitals Value Taken Time  BP    Temp    Pulse 85 09/05/2017  7:01 PM  Resp 22 09/05/2017  7:01 PM  SpO2 100 % 09/05/2017  7:01 PM  Vitals shown include unvalidated device data.  Last Pain:  Vitals:   09/05/17 1550  TempSrc: Oral  PainSc:       Patients Stated Pain Goal: 6 (09/05/17 1236)  Complications: No apparent anesthesia complications

## 2017-09-07 NOTE — Anesthesia Postprocedure Evaluation (Signed)
Anesthesia Post Note  Patient: Leah Gray  Procedure(s) Performed: LAPAROSCOPY OPERATIVE WITH RIGHT SALPINGECTOMY (Right Abdomen)     Patient location during evaluation: PACU Anesthesia Type: General Level of consciousness: awake and alert Pain management: pain level controlled Vital Signs Assessment: post-procedure vital signs reviewed and stable Respiratory status: spontaneous breathing, nonlabored ventilation, respiratory function stable and patient connected to nasal cannula oxygen Cardiovascular status: blood pressure returned to baseline and stable Postop Assessment: no apparent nausea or vomiting Anesthetic complications: no    Last Vitals:  Vitals:   09/05/17 2015 09/05/17 2042  BP: 118/68 117/67  Pulse: 82 70  Resp: 18 18  Temp:    SpO2: 100% 100%    Last Pain:  Vitals:   09/05/17 2042  TempSrc:   PainSc: 0-No pain                 Phillips Grout

## 2017-09-08 ENCOUNTER — Encounter (HOSPITAL_COMMUNITY): Payer: Self-pay | Admitting: Family Medicine

## 2017-09-11 ENCOUNTER — Ambulatory Visit (INDEPENDENT_AMBULATORY_CARE_PROVIDER_SITE_OTHER): Payer: Self-pay | Admitting: *Deleted

## 2017-09-11 VITALS — BP 109/65 | HR 68 | Temp 98.6°F

## 2017-09-11 DIAGNOSIS — Z9889 Other specified postprocedural states: Secondary | ICD-10-CM

## 2017-09-11 DIAGNOSIS — Z4889 Encounter for other specified surgical aftercare: Secondary | ICD-10-CM

## 2017-09-11 NOTE — Progress Notes (Signed)
Pt presents to clinic for wound check. S/p laproscopic right salphingectomy 6 days ago. Honeycomb dressing is in place at umbilicus, CDI. Removed dressing. Wound is healing well, no edema, drainage, or erythema. Reassured patient that all is well, reviewed s/s of infection and to return if she notices any of these. Patient voice understanding.

## 2017-09-12 NOTE — Progress Notes (Signed)
I was present at the time of this nurse visit.  Agree with nursing documentation.   Julie P. Degele, MD OB Fellow   

## 2017-09-22 ENCOUNTER — Encounter: Payer: Self-pay | Admitting: Family Medicine

## 2017-09-22 ENCOUNTER — Ambulatory Visit: Payer: Self-pay | Admitting: Family Medicine

## 2017-09-22 NOTE — Progress Notes (Signed)
Per Dr. Shawnie Pons, pt does not need to be contacted in regards to missed appt.

## 2017-09-22 NOTE — Progress Notes (Signed)
Patient did not keep appointment today. She may call to reschedule.  

## 2018-09-05 ENCOUNTER — Inpatient Hospital Stay (HOSPITAL_COMMUNITY)
Admission: EM | Admit: 2018-09-05 | Discharge: 2018-09-05 | Disposition: A | Discharge status: Home / Self Care | Payer: Self-pay | Attending: Obstetrics and Gynecology | Admitting: Obstetrics and Gynecology

## 2018-09-05 ENCOUNTER — Encounter (HOSPITAL_COMMUNITY): Payer: Self-pay | Admitting: Student

## 2018-09-05 ENCOUNTER — Inpatient Hospital Stay (HOSPITAL_COMMUNITY): Payer: Self-pay

## 2018-09-05 ENCOUNTER — Other Ambulatory Visit: Payer: Self-pay

## 2018-09-05 DIAGNOSIS — Z3A01 Less than 8 weeks gestation of pregnancy: Secondary | ICD-10-CM | POA: Insufficient documentation

## 2018-09-05 DIAGNOSIS — Z3201 Encounter for pregnancy test, result positive: Secondary | ICD-10-CM

## 2018-09-05 DIAGNOSIS — N3000 Acute cystitis without hematuria: Secondary | ICD-10-CM

## 2018-09-05 DIAGNOSIS — Z87891 Personal history of nicotine dependence: Secondary | ICD-10-CM | POA: Insufficient documentation

## 2018-09-05 DIAGNOSIS — O2311 Infections of bladder in pregnancy, first trimester: Secondary | ICD-10-CM | POA: Insufficient documentation

## 2018-09-05 DIAGNOSIS — O26891 Other specified pregnancy related conditions, first trimester: Secondary | ICD-10-CM

## 2018-09-05 DIAGNOSIS — R102 Pelvic and perineal pain: Secondary | ICD-10-CM | POA: Insufficient documentation

## 2018-09-05 DIAGNOSIS — O3680X0 Pregnancy with inconclusive fetal viability, not applicable or unspecified: Secondary | ICD-10-CM

## 2018-09-05 LAB — CBC WITH DIFFERENTIAL/PLATELET
Abs Immature Granulocytes: 0.01 10*3/uL (ref 0.00–0.07)
Basophils Absolute: 0.1 10*3/uL (ref 0.0–0.1)
Basophils Relative: 1 %
Eosinophils Absolute: 0.1 10*3/uL (ref 0.0–0.5)
Eosinophils Relative: 1 %
HCT: 40.8 % (ref 36.0–46.0)
Hemoglobin: 13.9 g/dL (ref 12.0–15.0)
Immature Granulocytes: 0 %
Lymphocytes Relative: 26 %
Lymphs Abs: 1.9 10*3/uL (ref 0.7–4.0)
MCH: 32.8 pg (ref 26.0–34.0)
MCHC: 34.1 g/dL (ref 30.0–36.0)
MCV: 96.2 fL (ref 80.0–100.0)
Monocytes Absolute: 0.4 10*3/uL (ref 0.1–1.0)
Monocytes Relative: 6 %
Neutro Abs: 4.8 10*3/uL (ref 1.7–7.7)
Neutrophils Relative %: 66 %
Platelets: 287 10*3/uL (ref 150–400)
RBC: 4.24 MIL/uL (ref 3.87–5.11)
RDW: 12.3 % (ref 11.5–15.5)
WBC: 7.2 10*3/uL (ref 4.0–10.5)
nRBC: 0 % (ref 0.0–0.2)

## 2018-09-05 LAB — URINALYSIS, ROUTINE W REFLEX MICROSCOPIC
Bilirubin Urine: NEGATIVE
Glucose, UA: NEGATIVE mg/dL
Hgb urine dipstick: NEGATIVE
Ketones, ur: NEGATIVE mg/dL
Nitrite: POSITIVE — AB
Protein, ur: NEGATIVE mg/dL
Specific Gravity, Urine: 1.023 (ref 1.005–1.030)
pH: 7 (ref 5.0–8.0)

## 2018-09-05 LAB — WET PREP, GENITAL
Sperm: NONE SEEN
Trich, Wet Prep: NONE SEEN
WBC, Wet Prep HPF POC: NONE SEEN
Yeast Wet Prep HPF POC: NONE SEEN

## 2018-09-05 LAB — COMPREHENSIVE METABOLIC PANEL
ALT: 15 U/L (ref 0–44)
AST: 40 U/L (ref 15–41)
Albumin: 4.4 g/dL (ref 3.5–5.0)
Alkaline Phosphatase: 62 U/L (ref 38–126)
Anion gap: 11 (ref 5–15)
BUN: 6 mg/dL (ref 6–20)
CO2: 22 mmol/L (ref 22–32)
Calcium: 8.9 mg/dL (ref 8.9–10.3)
Chloride: 102 mmol/L (ref 98–111)
Creatinine, Ser: 0.56 mg/dL (ref 0.44–1.00)
GFR calc Af Amer: >60 mL/min (ref >60)
GFR calc non Af Amer: >60 mL/min (ref >60)
Glucose, Bld: 103 mg/dL — ABNORMAL HIGH (ref 70–99)
Potassium: 4.5 mmol/L (ref 3.5–5.1)
Sodium: 135 mmol/L (ref 135–145)
Total Bilirubin: 1.8 mg/dL — ABNORMAL HIGH (ref 0.3–1.2)
Total Protein: 6.9 g/dL (ref 6.5–8.1)

## 2018-09-05 LAB — I-STAT BETA HCG BLOOD, ED (MC, WL, AP ONLY): I-stat hCG, quantitative: 1337.4 m[IU]/mL — ABNORMAL HIGH (ref <5)

## 2018-09-05 MED ORDER — ONDANSETRON HCL 4 MG/2ML IJ SOLN
4.0000 mg | Freq: Once | INTRAMUSCULAR | Status: AC
  Administered 2018-09-05: 14:00:00 4 mg via INTRAVENOUS
  Filled 2018-09-05: qty 2

## 2018-09-05 NOTE — ED Notes (Signed)
Report called to MAU  

## 2018-09-05 NOTE — MAU Note (Signed)
Pt reports lower abd. That she rates 2/10 since last night.  Pt denies vag. Bleeding, but reports hx of ectopic about 1 year ago.  Pt also reports burning during urination.

## 2018-09-05 NOTE — ED Triage Notes (Signed)
Pt arrives POV for left sided abd pain since yesterday. Pt reports pain is intermittent, currently 2/10. Pain worse with sitting up. Pt reports burning on urination. Pt reports episode of emesis yesterday after attempting to drink something. Pt alert, oriented, and ambulatory.

## 2018-09-05 NOTE — MAU Provider Note (Signed)
History     CSN: [id number]  Arrival date and time: 09/05/18 1331   First Provider Initiated Contact with Patient 09/05/18 1601      Chief Complaint  Patient presents with  . Pelvic Pain   [name] is a 23 y.o. G2P0010 at [id number] who presents today from the ED for evaluation of lower abdominal pain in pregnancy. She was seen over there and had blood work and pelvic exam done. She is here now for US for evaluation of ectopic pregnancy. Patient had a ruptured ectopic pregnancy in May of 2019.   Pelvic Pain  The patient's primary symptoms include pelvic pain. The patient's pertinent negatives include no vaginal discharge. This is a new problem. The current episode started yesterday. The problem occurs intermittently. The problem has been gradually worsening. Pain severity now: 2/10. The problem affects both sides. She is pregnant. Associated symptoms include dysuria, nausea, urgency and vomiting. Pertinent negatives include no chills, diarrhea, fever or frequency. The vaginal discharge was normal. There has been no bleeding. Nothing aggravates the symptoms. She has tried acetaminophen for the symptoms. The treatment provided significant relief. She uses nothing for contraception. Her menstrual history has been regular (LMP 07/24/2018 ).    OB History    Gravida  2   Para      Term      Preterm      AB  1   Living        SAB      TAB      Ectopic  1   Multiple      Live Births              Past Medical History:  Diagnosis Date  . Medical history non-contributory     Past Surgical History:  Procedure Laterality Date  . ANKLE FRACTURE SURGERY  2014   bilateral   . ANKLE SURGERY    . LAPAROSCOPY Right 09/05/2017   Procedure: LAPAROSCOPY OPERATIVE WITH RIGHT SALPINGECTOMY;  Surgeon: Reva BoresPratt, Tanya S, MD;  Location: [facility];  Service: Gynecology;  Laterality: Right;    Family History  Problem Relation Age of Onset  . Asthma Mother   . Cancer Mother   .  Miscarriages / Stillbirths Maternal Aunt   . COPD Maternal Grandmother   . Anxiety disorder Maternal Grandfather   . Depression Maternal Grandfather   . Diabetes Maternal Grandfather     Social History   Tobacco Use  . Smoking status: Former Games developermoker  . Smokeless tobacco: Never Used  Substance Use Topics  . Alcohol use: Yes    Comment: social   . Drug use: Never    Allergies: No Known Allergies  Medications Prior to Admission  Medication Sig Dispense Refill Last Dose  . acetaminophen (TYLENOL) 325 MG tablet Take by mouth.   Taking  . oxyCODONE (OXY IR/ROXICODONE) 5 MG immediate release tablet Take 1 tablet (5 mg total) by mouth every 6 (six) hours as needed for severe pain. (Patient not taking: Reported on 09/11/2017) 12 tablet 0 Not Taking  . oxyCODONE-acetaminophen (PERCOCET/ROXICET) 5-325 MG tablet Take 1 tablet by mouth every 6 (six) hours as needed. 12 tablet 0 Taking    Review of Systems  Constitutional: Negative for chills and fever.  Gastrointestinal: Positive for nausea and vomiting. Negative for diarrhea.  Genitourinary: Positive for dysuria, pelvic pain and urgency. Negative for frequency, vaginal bleeding and vaginal discharge.   Physical Exam   Blood pressure 118/73, pulse 87, temperature 98.5 F (36.9  C), temperature source Oral, resp. rate 16, height  (1.575 m), weight 66 kg, last menstrual period 07/24/2018, SpO2 100 %, unknown if currently breastfeeding.  Physical Exam  Nursing note and vitals reviewed. Constitutional: She is oriented to person, place, and time. She appears well-developed and well-nourished. No distress.  HENT:  Head: Normocephalic.  Cardiovascular: Normal rate.  Respiratory: Effort normal.  GI: Soft. There is no abdominal tenderness. There is no rebound.  Neurological: She is alert and oriented to person, place, and time.  Skin: Skin is warm and dry.  Psychiatric: She has a normal mood and affect.   Results for orders placed or  performed during the hospital encounter of 09/05/18 (from the past 24 hour(s))  Urinalysis, Routine w reflex microscopic     Status: Abnormal   Collection Time: 09/05/18  2:13 PM  Result Value Ref Range   Color, Urine YELLOW YELLOW   APPearance CLOUDY (A) CLEAR   Specific Gravity, Urine 1.023 1.005 - 1.030   pH 7.0 5.0 - 8.0   Glucose, UA NEGATIVE NEGATIVE mg/dL   Hgb urine dipstick NEGATIVE NEGATIVE   Bilirubin Urine NEGATIVE NEGATIVE   Ketones, ur NEGATIVE NEGATIVE mg/dL   Protein, ur NEGATIVE NEGATIVE mg/dL   Nitrite POSITIVE (A) NEGATIVE   Leukocytes,Ua TRACE (A) NEGATIVE   RBC / HPF 0-5 0 - 5 RBC/hpf   WBC, UA 11-20 0 - 5 WBC/hpf   Bacteria, UA FEW (A) NONE SEEN   Squamous Epithelial / LPF 6-10 0 - 5   Mucus PRESENT   CBC with Differential     Status: None   Collection Time: 09/05/18  2:21 PM  Result Value Ref Range   WBC 7.2 4.0 - 10.5 K/uL   RBC 4.24 3.87 - 5.11 MIL/uL   Hemoglobin 13.9 12.0 - 15.0 g/dL   HCT 16.1 09.6 - 04.5 %   MCV 96.2 80.0 - 100.0 fL   MCH 32.8 26.0 - 34.0 pg   MCHC 34.1 30.0 - 36.0 g/dL   RDW 40.9 81.1 - 91.4 %   Platelets 287 150 - 400 K/uL   nRBC 0.0 0.0 - 0.2 %   Neutrophils Relative % 66 %   Neutro Abs 4.8 1.7 - 7.7 K/uL   Lymphocytes Relative 26 %   Lymphs Abs 1.9 0.7 - 4.0 K/uL   Monocytes Relative 6 %   Monocytes Absolute 0.4 0.1 - 1.0 K/uL   Eosinophils Relative 1 %   Eosinophils Absolute 0.1 0.0 - 0.5 K/uL   Basophils Relative 1 %   Basophils Absolute 0.1 0.0 - 0.1 K/uL   Immature Granulocytes 0 %   Abs Immature Granulocytes 0.01 0.00 - 0.07 K/uL  Comprehensive metabolic panel     Status: Abnormal   Collection Time: 09/05/18  2:21 PM  Result Value Ref Range   Sodium 135 135 - 145 mmol/L   Potassium 4.5 3.5 - 5.1 mmol/L   Chloride 102 98 - 111 mmol/L   CO2 22 22 - 32 mmol/L   Glucose, Bld 103 (H) 70 - 99 mg/dL   BUN 6 6 - 20 mg/dL   Creatinine, Ser 7.82 0.44 - 1.00 mg/dL   Calcium 8.9 8.9 - 95.6 mg/dL   Total Protein 6.9 6.5  - 8.1 g/dL   Albumin 4.4 3.5 - 5.0 g/dL   AST 40 15 - 41 U/L   ALT 15 0 - 44 U/L   Alkaline Phosphatase 62 38 - 126 U/L   Total Bilirubin 1.8 (H) 0.3 -  1.2 mg/dL   GFR calc non Af Amer >60 >60 mL/min   GFR calc Af Amer >60 >60 mL/min   Anion gap 11 5 - 15  I-Stat beta hCG blood, ED     Status: Abnormal   Collection Time: 09/05/18  2:37 PM  Result Value Ref Range   I-stat hCG, quantitative 1,337.4 (H) <5 mIU/mL   Comment 3          Wet prep, genital     Status: Abnormal   Collection Time: 09/05/18  2:45 PM  Result Value Ref Range   Yeast Wet Prep HPF POC NONE SEEN NONE SEEN   Trich, Wet Prep NONE SEEN NONE SEEN   Clue Cells Wet Prep HPF POC PRESENT (A) NONE SEEN   WBC, Wet Prep HPF POC NONE SEEN NONE SEEN   Sperm NONE SEEN    US Ob Less Than 14 Weeks With Ob Transvaginal  Result Date: 09/05/2018 CLINICAL DATA:  Pelvic pain EXAM: OBSTETRIC <14 WK [place] AND TRANSVAGINAL OB US TECHNIQUE: Both transabdominal and transvaginal ultrasound examinations were performed for complete evaluation of the gestation as well as the maternal uterus, adnexal regions, and pelvic cul-de-sac. Transvaginal technique was performed to assess early pregnancy. COMPARISON:  None. FINDINGS: Intrauterine gestational sac: Absent Maternal uterus/adnexae: Endometrium measures 12 mm in thickness. Considerable free fluid is noted. Hemorrhagic appearing cyst is noted within the left ovary. A vague hypoechoic area is noted adjacent to the right adnexa of uncertain significance. No definitive extra uterine pregnancy is seen. IMPRESSION: No definitive intra or extra uterine pregnancy is noted at this time although the beta HCG level is significantly low. Apparent hemorrhagic cyst in the right ovary. Large amount of free pelvic fluid. Electronically Signed   By: Alcide Clever M.D.   On: 09/05/2018 17:07    MAU Course  Procedures  MDM   Assessment and Plan   1. Suprapubic abdominal pain   2. Positive pregnancy test   3.  Acute cystitis without hematuria   4. Pelvic pain in pregnancy, antepartum, first trimester   5. Pregnancy of unknown anatomic location    DC home 1stTrimester precautions  Bleeding precautions Ectopic precautions RX: none  Return to MAU as needed Repeat HCG in 48 hours at the [facility] [address], appt scheduled   Follow-up Information    [facility] Follow up.   Specialty:  Obstetrics and Gynecology Why:  Wednesday Sep 07, 2018 at 2:00 pm for lab work  Contact information: [address], [address] [id number] mc [place] [address] [phone]         Thressa Sheller DNP, CNM  09/05/18  5:14 PM

## 2018-09-05 NOTE — Discharge Instructions (Signed)

## 2018-09-05 NOTE — ED Provider Notes (Signed)
[facility] EMERGENCY DEPARTMENT Provider Note   CSN: [id number] Arrival date & time: 09/05/18  1331    History   Chief Complaint Chief Complaint  Patient presents with  . Abdominal Pain    HPI Leah Gray is a 23 y.o. female w/ a hx of prior ectopic pregnancy who presents to the ED with complaints of abdominal pain that began last night. She states that she went to [employer] and shortly after had some upper abdominal pain w/ nausea & 1 episode of emesis. She was then able to go to sleep, but awoke with primarily lower abdominal pain in the suprapubic area described as intermittent and pressure/cramping in nature. No alleviating/aggravating factors. Has associated continued nausea & now some dysuria. Denies fever, hematemesis, diarrhea, constipation, vaginal bleeding, or vaginal discharge. States she is not concerned for STD. Sexually active w/ 1 female partner, no protection being utilized. LMP 08/24/18.     HPI  Past Medical History:  Diagnosis Date  . Medical history non-contributory     Patient Active Problem List   Diagnosis Date Noted  . 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 ANKLE FRACTURE SURGERY  2014   bilateral   . ANKLE SURGERY    . LAPAROSCOPY Right 09/05/2017   Procedure: LAPAROSCOPY OPERATIVE WITH RIGHT SALPINGECTOMY;  Surgeon: Reva Bores, MD;  Location: [facility];  Service: Gynecology;  Laterality: Right;     OB History    Gravida  1   Para      Term      Preterm      AB      Living        SAB      TAB      Ectopic      Multiple      Live Births               Home Medications    Prior to Admission medications   Medication Sig Start Date End Date Taking? Authorizing Provider  acetaminophen (TYLENOL) 325 MG tablet Take by mouth.    [provider]  oxyCODONE (OXY IR/ROXICODONE) 5 MG immediate release tablet Take 1 tablet  (5 mg total) by mouth every 6 (six) hours as needed for severe pain. Patient not taking: Reported on 09/11/2017 09/05/17   Reva Bores, MD  oxyCODONE-acetaminophen (PERCOCET/ROXICET) 5-325 MG tablet Take 1 tablet by mouth every 6 (six) hours as needed. 09/05/17   Reva Bores, MD    Family History Family History  Problem Relation Age of Onset  . Asthma Mother   . Cancer Mother   . Miscarriages / Stillbirths Maternal Aunt   . COPD Maternal Grandmother   . Anxiety disorder Maternal Grandfather   . Depression Maternal Grandfather   . Diabetes Maternal Grandfather     Social History Social History   Tobacco Use  . Smoking status: Former Games developer  . Smokeless tobacco: Never Used  Substance Use Topics  . Alcohol use: Yes    Comment: social   . Drug use: Never     Allergies   Patient has no known allergies.   Review of Systems Review of Systems  Constitutional: Negative for fever.  Respiratory: Negative for shortness of breath.   Cardiovascular: Negative for chest pain.  Gastrointestinal: Positive for abdominal pain, nausea and vomiting. Negative for blood in stool, constipation and diarrhea.  Genitourinary: Positive for dysuria and pelvic  pain. Negative for vaginal bleeding and vaginal discharge.  All other systems reviewed and are negative.    Physical Exam Updated Vital Signs There were no vitals taken for this visit.  Physical Exam Vitals signs and nursing note reviewed. Exam conducted with a chaperone present.  Constitutional:      General: She is not in acute distress.    Appearance: She is well-developed. She is not toxic-appearing.  HENT:     Head: Normocephalic and atraumatic.  Eyes:     General:        Right eye: No discharge.        Left eye: No discharge.     Conjunctiva/sclera: Conjunctivae normal.  Neck:     Musculoskeletal: Neck supple.  Cardiovascular:     Rate and Rhythm: Normal rate and regular rhythm.  Pulmonary:     Effort: Pulmonary  effort is normal. No respiratory distress.     Breath sounds: Normal breath sounds. No wheezing, rhonchi or rales.  Abdominal:     General: There is no distension.     Palpations: Abdomen is soft.     Tenderness: There is abdominal tenderness in the suprapubic area. There is no guarding or rebound.  Genitourinary:    Exam position: Supine.     Labia:        Right: No lesion.        Left: No lesion.      Cervix: No cervical motion tenderness.     Adnexa:        Right: No mass, tenderness or fullness.         Left: No mass, tenderness or fullness.       Comments: RN Neldon Labellammy present as chaperone.  Skin:    General: Skin is warm and dry.     Findings: No rash.  Neurological:     Mental Status: She is alert.     Comments: Clear speech.   Psychiatric:        Behavior: Behavior normal.    ED Treatments / Results  Labs (all labs ordered are listed, but only abnormal results are displayed) Labs Reviewed  WET PREP, GENITAL - Abnormal; Notable for the following components:      Result Value   Clue Cells Wet Prep HPF POC PRESENT (*)    All other components within normal limits  COMPREHENSIVE METABOLIC PANEL - Abnormal; Notable for the following components:   Glucose, Bld 103 (*)    Total Bilirubin 1.8 (*)    All other components within normal limits  URINALYSIS, ROUTINE W REFLEX MICROSCOPIC - Abnormal; Notable for the following components:   APPearance CLOUDY (*)    Nitrite POSITIVE (*)    Leukocytes,Ua TRACE (*)    Bacteria, UA FEW (*)    All other components within normal limits  I-STAT BETA HCG BLOOD, ED ([facility], [facility], [facility] ONLY) - Abnormal; Notable for the following components:   I-stat hCG, quantitative 1,337.4 (*)    All other components within normal limits  URINE CULTURE  CBC WITH DIFFERENTIAL/PLATELET  HIV ANTIBODY (ROUTINE TESTING W REFLEX)  RPR  GC/CHLAMYDIA PROBE AMP ([place]) NOT AT [facility]    EKG None  Radiology No results found.  Procedures Procedures (including  critical care time)  Medications Ordered in ED Medications  ondansetron (ZOFRAN) injection 4 mg (4 mg Intravenous Given 09/05/18 1416)     Initial Impression / Assessment and Plan / ED Course  I have reviewed the triage vital signs and the nursing  notes.  Pertinent labs & imaging results that were available during my care of the patient were reviewed by me and considered in my medical decision making (see chart for details).   Patient w/ hx of ectopic pregnancy presents to the ER w/ suprapubic abdominal pain associated with N/V & dysuria. Nontoxic appearing, no apparent distress, vitals WNL. Abdominal exam w/ suprapubic tenderness to palpation, no adnexal or cervical motion tenderness w/ bimanual, no vaginal bleeding. Zofran administered, shortly following this pregnancy test returned positive- will discuss transfer w/ MAU provider.   14:50: CONSULT: [facility].   Labs reviewed:  CBC: No leukocytosis or anemia.  CMP: T bili mildly elevated, otherwise WNL. I-stat beta hCG: elevated @ 1337.4 Wet prep: Clue cells present.  UA: Suspect UTI w/ her urinary sxs, nitrite positive.   + pregnancy test, clue cells on wet prep, & UTI w/ ER work-up. She does have hx of ectopic pregnancy, states this does not feel similar to her sxs at that time, vitals signs WNL appears hemodynamically stable at this time, will [facility] to [facility] for [facility] provider evaluation & [place]. Defer abx to MAU team.   Patient updated on + pregnancy results & plan of care, provided opportunity for questions, patient confirmed understanding & is agreeable.   Findings and plan of care discussed with supervising physician Dr. Rosalia Hammers who is in agreement.   Final Clinical Impressions(s) / ED Diagnoses   Final diagnoses:  Suprapubic abdominal pain  Positive pregnancy test  Acute cystitis without hematuria    ED Discharge Orders    None       Cherly Anderson, PA-C 09/05/18 1511     Margarita Grizzle, MD 09/05/18 1702

## 2018-09-05 NOTE — ED Notes (Signed)
Transport arrived, pt brought to MAU.

## 2018-09-06 LAB — RPR: RPR Ser Ql: NONREACTIVE

## 2018-09-06 LAB — GC/CHLAMYDIA PROBE AMP (~~LOC~~) NOT AT ARMC
Chlamydia: NEGATIVE
Neisseria Gonorrhea: NEGATIVE

## 2018-09-06 LAB — HIV ANTIBODY (ROUTINE TESTING W REFLEX): HIV Screen 4th Generation wRfx: NONREACTIVE

## 2018-09-07 ENCOUNTER — Other Ambulatory Visit: Payer: Self-pay

## 2018-09-07 LAB — URINE CULTURE: Culture: >=100000 — AB

## 2018-09-08 ENCOUNTER — Other Ambulatory Visit: Payer: Self-pay

## 2018-09-08 ENCOUNTER — Ambulatory Visit (INDEPENDENT_AMBULATORY_CARE_PROVIDER_SITE_OTHER): Payer: Self-pay | Admitting: *Deleted

## 2018-09-08 DIAGNOSIS — O3680X0 Pregnancy with inconclusive fetal viability, not applicable or unspecified: Secondary | ICD-10-CM

## 2018-09-08 LAB — BETA HCG QUANT (REF LAB): hCG Quant: 609 m[IU]/mL

## 2018-09-08 NOTE — Progress Notes (Signed)
Pt presents for STAT bhcg following an MAU visit on 09/05/18 for abdominal pain in pregnancy.  Per chart review, pt has history of ruptured ectopic pregnancy in May of 2019 and no IUP seen on ultrasound on 09/05/18.  Pt denies current abdominal pain or vaginal bleeding.   Advised pt of the reason for her visit and that the lab will be drawn and it takes 1-2 hours to results and she will be contacted with those results and a plan of care.  Pt verbalized understanding and states that a good number where she can be reached is [phone].  STAT Hcg resulted as 609.  Discussed with Dr. Macon Large and d/t pregnancy's unknown location, although probable miscarriage, pt will need to come back in 2 days for another STAT hcg to ensure it continues to decrease. Strict ectopic precautions reviewed with pt. Pt verbalized understanding and stated she would return on Friday @ 0815 for the lab.

## 2018-09-09 NOTE — Progress Notes (Signed)
I have reviewed the chart and agree with nursing staff's documentation of this patient's encounter.  Jaynie Collins, MD 09/09/2018 1:09 PM

## 2018-09-10 ENCOUNTER — Other Ambulatory Visit: Payer: Self-pay

## 2018-09-10 ENCOUNTER — Ambulatory Visit (INDEPENDENT_AMBULATORY_CARE_PROVIDER_SITE_OTHER): Payer: Self-pay

## 2018-09-10 DIAGNOSIS — O3680X0 Pregnancy with inconclusive fetal viability, not applicable or unspecified: Secondary | ICD-10-CM

## 2018-09-10 LAB — BETA HCG QUANT (REF LAB): hCG Quant: 392 m[IU]/mL

## 2018-09-10 NOTE — Progress Notes (Signed)
Agree with A & P. 

## 2018-09-10 NOTE — Progress Notes (Signed)
Pt here today for STAT Beta Lab.  Pt denies any pain or bleeding.  Pt advised that it will take two hours for the results and f/u and that we will give her a call.  Pt agreed.    Received results from [facility] beta result of 392.  Notified Dr. Alysia Penna who recommends pt to come back in one week for non stat beta lab.  Informed pt and pt stated that she would be able to come in on 09/17/18 @ 0830 for lab draw.  Front office notified to place on schedule.   Addison Naegeli, RN

## 2018-09-17 ENCOUNTER — Other Ambulatory Visit: Payer: Self-pay

## 2018-09-17 DIAGNOSIS — O3680X0 Pregnancy with inconclusive fetal viability, not applicable or unspecified: Secondary | ICD-10-CM

## 2018-09-18 LAB — BETA HCG QUANT (REF LAB): hCG Quant: 213 m[IU]/mL

## 2018-09-21 ENCOUNTER — Telehealth: Payer: Self-pay

## 2018-09-21 ENCOUNTER — Other Ambulatory Visit: Payer: Self-pay

## 2018-09-21 DIAGNOSIS — O3680X0 Pregnancy with inconclusive fetal viability, not applicable or unspecified: Secondary | ICD-10-CM

## 2018-09-21 NOTE — Telephone Encounter (Addendum)
-----   Message from Tereso Newcomer, MD sent at 09/18/2018  5:54 PM EDT ----- Not decreasing appropriately, decreased less than 50% in one week.  Needs follow up HCG (ask to come in on 5/26 or 5/27) and continue ectopic precautions. Will follow up results and manage accordingly.  Notified pt provider's recommendation.  Pt stated that she would be come in tomorrow 09/22/18 @ 0900 for STAT beta lab.  Front notified to place pt on schedule.

## 2018-09-21 NOTE — Addendum Note (Signed)
Addended by: Faythe Casa on: 09/21/2018 10:50 AM   Modules accepted: Orders

## 2018-09-22 ENCOUNTER — Other Ambulatory Visit: Payer: Self-pay | Admitting: General Practice

## 2018-09-22 ENCOUNTER — Other Ambulatory Visit: Payer: Self-pay

## 2018-09-22 ENCOUNTER — Telehealth: Payer: Self-pay | Admitting: General Practice

## 2018-09-22 DIAGNOSIS — O3680X0 Pregnancy with inconclusive fetal viability, not applicable or unspecified: Secondary | ICD-10-CM

## 2018-09-22 LAB — BETA HCG QUANT (REF LAB): hCG Quant: 211 m[IU]/mL

## 2018-09-22 NOTE — Telephone Encounter (Signed)
Called patient & informed her of bhcg results and need for repeat bhcg in 1 week. Discussed her bhcg has plateaued at this time but we will continue to follow in the meantime. Ectopic precautions reviewed. Patient verbalized understanding & states she can come 6/3 @ 10am. Does not need to be stat per Dr Alysia Penna.

## 2018-09-22 NOTE — Progress Notes (Signed)
Patient presents to office for stat bhcg today. Patient reports she had a period for 5 days that stopped yesterday. Discussed with patient we are monitoring her levels today as they did not decrease as expected. Told patient I would contact her with results within a couple hours. Patient provided phone number [phone]. Told patient I would call her there. Patient had no questions at this time.  Reviewed results with Dr Alysia Penna who states patient should have follow up bhcg in 1 week to continue to monitor bhcg trend. Will call patient with results.  Chase Caller RN BSN 09/22/18

## 2018-09-22 NOTE — Progress Notes (Signed)
Agree with A & P. 

## 2018-09-27 ENCOUNTER — Other Ambulatory Visit: Payer: Self-pay | Admitting: *Deleted

## 2018-09-27 DIAGNOSIS — O3680X0 Pregnancy with inconclusive fetal viability, not applicable or unspecified: Secondary | ICD-10-CM

## 2018-09-29 ENCOUNTER — Other Ambulatory Visit: Payer: Self-pay

## 2018-10-05 ENCOUNTER — Other Ambulatory Visit: Payer: Self-pay

## 2018-10-05 DIAGNOSIS — O3680X0 Pregnancy with inconclusive fetal viability, not applicable or unspecified: Secondary | ICD-10-CM

## 2018-10-06 ENCOUNTER — Telehealth (INDEPENDENT_AMBULATORY_CARE_PROVIDER_SITE_OTHER): Payer: Self-pay

## 2018-10-06 DIAGNOSIS — O00101 Right tubal pregnancy without intrauterine pregnancy: Secondary | ICD-10-CM

## 2018-10-06 LAB — BETA HCG QUANT (REF LAB): hCG Quant: 182 m[IU]/mL

## 2018-10-06 NOTE — Telephone Encounter (Addendum)
-----   Message from [name], MD sent at 10/06/2018  8:25 AM EDT ----- Repeat BHCG in 1 week, routine Thanks Leah Gray  Attempted to call pt and was unable LVM due to VM box not set up yet.

## 2018-10-07 NOTE — Telephone Encounter (Signed)
Called pt to inform her that her bhcg levels dropped and she needed a follow up non-stat bhcg in 1 week.  Pt verbalized understanding.  Message sent to front office staff to schedule.

## 2018-10-12 ENCOUNTER — Telehealth: Payer: Self-pay | Admitting: Family Medicine

## 2018-10-12 NOTE — Telephone Encounter (Signed)
Attempted to call patient twice. Was not able to leave a message due to her phone was not available.

## 2018-10-15 ENCOUNTER — Telehealth: Payer: Self-pay | Admitting: Family Medicine

## 2018-10-15 NOTE — Telephone Encounter (Signed)
Spoke with patient about her appointment on 6/22 @ 1:50. Patient was instructed to wear a face mask and no visitors are allowed with her. Patient was screened for covid symptoms and denied having any.

## 2018-10-18 ENCOUNTER — Other Ambulatory Visit: Payer: Self-pay

## 2018-10-18 DIAGNOSIS — O3680X0 Pregnancy with inconclusive fetal viability, not applicable or unspecified: Secondary | ICD-10-CM

## 2018-10-19 ENCOUNTER — Telehealth: Payer: Self-pay | Admitting: *Deleted

## 2018-10-19 LAB — BETA HCG QUANT (REF LAB): hCG Quant: 120 m[IU]/mL

## 2018-10-19 NOTE — Telephone Encounter (Signed)
I called Leah Gray per results message from Dr. Rip Harbour and informed her of her bhcg result from yesterday and that he was concerned her bhcg level is not dropping appropriately and he wants her to have appointment this week to discuss results and possible tx with mtx. She voices understanding. I informed her I would have registrars call her with appointment.

## 2018-10-21 ENCOUNTER — Telehealth: Payer: Self-pay | Admitting: Family Medicine

## 2018-10-21 NOTE — Telephone Encounter (Signed)
Patient was called, and given help downloading her MyChart app. Information about this visit was discussed with patient. °

## 2018-10-22 ENCOUNTER — Encounter: Payer: Self-pay | Admitting: *Deleted

## 2018-10-22 ENCOUNTER — Encounter (HOSPITAL_COMMUNITY): Payer: Self-pay | Admitting: Advanced Practice Midwife

## 2018-10-22 ENCOUNTER — Encounter: Payer: Self-pay | Admitting: Family Medicine

## 2018-10-22 ENCOUNTER — Other Ambulatory Visit: Payer: Self-pay

## 2018-10-22 ENCOUNTER — Inpatient Hospital Stay (HOSPITAL_COMMUNITY)
Admission: AD | Admit: 2018-10-22 | Discharge: 2018-10-22 | Disposition: A | Discharge status: Home / Self Care | Payer: Medicaid Other | Attending: Obstetrics and Gynecology | Admitting: Obstetrics and Gynecology

## 2018-10-22 ENCOUNTER — Telehealth (INDEPENDENT_AMBULATORY_CARE_PROVIDER_SITE_OTHER): Payer: Medicaid Other | Admitting: Family Medicine

## 2018-10-22 DIAGNOSIS — O3680X0 Pregnancy with inconclusive fetal viability, not applicable or unspecified: Secondary | ICD-10-CM | POA: Diagnosis not present

## 2018-10-22 DIAGNOSIS — O009 Unspecified ectopic pregnancy without intrauterine pregnancy: Secondary | ICD-10-CM | POA: Insufficient documentation

## 2018-10-22 LAB — COMPREHENSIVE METABOLIC PANEL
ALT: 19 U/L (ref 0–44)
AST: 22 U/L (ref 15–41)
Albumin: 4.3 g/dL (ref 3.5–5.0)
Alkaline Phosphatase: 66 U/L (ref 38–126)
Anion gap: 7 (ref 5–15)
BUN: 10 mg/dL (ref 6–20)
CO2: 23 mmol/L (ref 22–32)
Calcium: 9 mg/dL (ref 8.9–10.3)
Chloride: 104 mmol/L (ref 98–111)
Creatinine, Ser: 0.59 mg/dL (ref 0.44–1.00)
GFR calc Af Amer: >60 mL/min (ref >60)
GFR calc non Af Amer: >60 mL/min (ref >60)
Glucose, Bld: 89 mg/dL (ref 70–99)
Potassium: 3.4 mmol/L — ABNORMAL LOW (ref 3.5–5.1)
Sodium: 134 mmol/L — ABNORMAL LOW (ref 135–145)
Total Bilirubin: 0.5 mg/dL (ref 0.3–1.2)
Total Protein: 6.9 g/dL (ref 6.5–8.1)

## 2018-10-22 LAB — CBC
HCT: 39.6 % (ref 36.0–46.0)
Hemoglobin: 13.4 g/dL (ref 12.0–15.0)
MCH: 32.4 pg (ref 26.0–34.0)
MCHC: 33.8 g/dL (ref 30.0–36.0)
MCV: 95.9 fL (ref 80.0–100.0)
Platelets: 314 10*3/uL (ref 150–400)
RBC: 4.13 MIL/uL (ref 3.87–5.11)
RDW: 12 % (ref 11.5–15.5)
WBC: 5.7 10*3/uL (ref 4.0–10.5)
nRBC: 0 % (ref 0.0–0.2)

## 2018-10-22 LAB — HCG, QUANTITATIVE, PREGNANCY: hCG, Beta Chain, Quant, S: 103 m[IU]/mL — ABNORMAL HIGH (ref <5)

## 2018-10-22 MED ORDER — METHOTREXATE FOR ECTOPIC PREGNANCY
50.0000 mg/m2 | Freq: Once | INTRAMUSCULAR | Status: AC
  Administered 2018-10-22: 85 mg via INTRAMUSCULAR
  Filled 2018-10-22: qty 1

## 2018-10-22 NOTE — Discharge Instructions (Signed)
Ectopic Pregnancy  An ectopic pregnancy happens when a fertilized egg grows outside the womb (uterus). The fertilized egg cannot stay alive outside of the womb. This problem often happens in a fallopian tube. It is often caused by damage to the tube. If this problem is found early, you may be treated with medicine that stops the egg from growing. If your tube tears or bursts open (ruptures), you will bleed inside. Often, there is very bad pain in the lower belly. This is an emergency. You will need surgery. Get help right away. Follow these instructions at home: After being treated with medicine or surgery:  Rest and limit your activity for as long as told by your doctor.  Until your doctor says that it is safe: ? Do not lift anything that is heavier than 10 lb (4.5 kg) or the limit that your doctor tells you. ? Avoid exercise and any movement that takes a lot of effort.  To prevent problems when pooping (constipation): ? Eat a healthy diet. This includes:  Fruits.  Vegetables.  Whole grains. ? Drink 6-8 glasses of water a day. Contact a doctor if: Get help right away if:  You have sudden and very bad pain in your belly.  You have very bad pain in your shoulders or neck.  You have pain that gets worse and is not helped by medicine.  You have: ? A fever or chills. ? Vaginal bleeding. ? Redness or swelling at the site of a surgical cut (incision).  You feel sick to your stomach (nauseous) or you throw up (vomit).  You feel dizzy or weak.  You feel light-headed or you pass out (faint). Summary  An ectopic pregnancy happens when a fertilized egg grows outside the womb (uterus).  If this problem is found early, you may be treated with medicine that stops the egg from growing.  If your tube tears or bursts open (ruptures), you will need surgery. This is an emergency. Get help right away. This information is not intended to replace advice given to you by your health care  provider. Make sure you discuss any questions you have with your health care provider. Document Released: 07/11/2008 Document Revised: 03/27/2017 Document Reviewed: 05/08/2016 Elsevier Patient Education  2020 [employer]   Methotrexate Treatment for an Ectopic Pregnancy, Care After This sheet gives you information about how to care for yourself after your procedure. Your health care provider may also give you more specific instructions. If you have problems or questions, contact your health care provider. What can I expect after the procedure? After the procedure, it is common to have:  Abdominal cramping.  Vaginal bleeding.  Fatigue.  Nausea.  Vomiting.  Diarrhea. Blood tests will be taken at timed intervals for several days or weeks to check your pregnancy hormone levels. The blood tests will be done until the pregnancy hormone can no longer be detected in the blood. Follow these instructions at home: Activity  Do not have sex until your health care provider approves.  Limit activities that take a lot of effort as told by your health care provider. Medicines  Take over the counter and prescription medicines only as told by your health care provider.  You can take Tylenol or extra strength Tylenol.  Do not take aspirin, ibuprofen, naproxen, or any other NSAIDs.  Do not take folic acid, prenatal vitamins, or other vitamins that contain folic acid. General instructions   Do not drink alcohol.  Follow instructions from your health care  provider on how and when to report any symptoms that may indicate a ruptured ectopic pregnancy.  Keep all follow-up visits as told by your health care provider. This is important. Contact a health care provider if:  You have persistent nausea and vomiting.  You have persistent diarrhea.  You are having a reaction to the medicine, such as: ? Tiredness. ? Skin rash. ? Hair loss. Get help right away if:  Your abdominal or pelvic  pain gets worse.  You have more vaginal bleeding.  You feel light-headed or you faint.  You have shortness of breath.  Your heart rate increases.  You develop a cough.  You have chills.  You have a fever. Summary  After the procedure, it is common to have symptoms of abdominal cramping, vaginal bleeding and fatigue. You may also experience other symptoms.  Blood tests will be taken at timed intervals for several days or weeks to check your pregnancy hormone levels. The blood tests will be done until the pregnancy hormone can no longer be detected in the blood.  Limit strenuous activity as told by your health care provider.  Follow instructions from your health care provider on how and when to report any symptoms that may indicate a ruptured ectopic pregnancy. This information is not intended to replace advice given to you by your health care provider. Make sure you discuss any questions you have with your health care provider. Document Released: 04/03/2011 Document Revised: 03/27/2017 Document Reviewed: 06/03/2016 Elsevier Patient Education  2020 [employer].

## 2018-10-22 NOTE — Progress Notes (Signed)
    TELEHEALTH GYNECOLOGY VIRTUAL VIDEO VISIT ENCOUNTER NOTE  Provider location: [facility] at [facility]   I connected with Leah Gray on 10/22/18 at 10:15 AM EDT by MyChart Video Encounter at home and verified that I am speaking with the correct person using two identifiers.   I discussed the limitations, risks, security and privacy concerns of performing an evaluation and management service by telephone and the availability of in person appointments. I also discussed with the patient that there may be a patient responsible charge related to this service. The patient expressed understanding and agreed to proceed.   History:  Leah Gray is a 23 y.o. [id number] female being evaluated today for f/u BCramping and bleeding today and having some bleeding. Its in the middle.HCG. Pregnacy eval began on 5/10 with pain and HCG was 1337.4.  Then fell to 609, 392, 213 on 5/22.  U/s showed free fluid and cyst on ovary. HCG levels have failed to go down significantly x 4 wks. Most recent level was 120 on 6/22. Reports cramping and bleeding today and having light bleeding. Its in the middle. And does not radiate.     Past Medical History:  Diagnosis Date  . Medical history non-contributory    Past Surgical History:  Procedure Laterality Date  . ANKLE FRACTURE SURGERY  2014   bilateral   . ANKLE SURGERY    . LAPAROSCOPY Right 09/05/2017   Procedure: LAPAROSCOPY OPERATIVE WITH RIGHT SALPINGECTOMY;  Surgeon: Donnamae Jude, MD;  Location: [facility];  Service: Gynecology;  Laterality: Right;   The following portions of the patient's history were reviewed and updated as appropriate: allergies, current medications, past family history, past medical history, past social history, past surgical history and problem list.     Review of Systems:  Pertinent items noted in HPI and remainder of comprehensive ROS otherwise negative.  Physical Exam:   General:  Alert, oriented and  cooperative. Patient appears to be in no acute distress.  Mental Status: Normal mood and affect. Normal behavior. Normal judgment and thought content.   Respiratory: Normal respiratory effort, no problems with respiration noted  Rest of physical exam deferred due to type of encounter  Labs and Imaging Results for orders placed or performed in visit on 10/18/18 (from the past 336 hour(s))  Beta hCG quant (ref lab)   Collection Time: 10/18/18  3:07 PM  Result Value Ref Range   hCG Quant 120 mIU/mL   No results found.     Assessment and Plan:     Problem List Items Addressed This Visit      Unprioritized   Pregnancy of unknown anatomic location    Per Dr. Rip Harbour for MTX. Reviewed finding with Dr. Harolyn Rutherford as well, and we are in agreement that to end the current pregnancy and due to plateauing x 4 wks, she should get MTX. Process described in full and she will proceed to MAU after work today.              I discussed the assessment and treatment plan with the patient. The patient was provided an opportunity to ask questions and all were answered. The patient agreed with the plan and demonstrated an understanding of the instructions.   Return in about 4 weeks (around 11/19/2018) for a CPE, in person.   I provided 11 minutes of face-to-face time during this encounter.   Donnamae Jude, MD [facility], [place]

## 2018-10-22 NOTE — MAU Note (Signed)
Pt sent to MAU for MTX injection due to platue BHCG.Pt denies any pain but does report vaginal bleeding. Stated she was on her period.

## 2018-10-22 NOTE — Assessment & Plan Note (Signed)
Per Dr. Rip Harbour for MTX. Reviewed finding with Dr. Harolyn Rutherford as well, and we are in agreement that to end the current pregnancy and due to plateauing x 4 wks, she should get MTX. Process described in full and she will proceed to MAU after work today.

## 2018-10-22 NOTE — MAU Provider Note (Signed)
History    First Provider Initiated Contact with Patient 10/22/18 1958     Chief Complaint:  Injections   Curtistine Pettitt is  23 y.o. G2P0010 Patient's last menstrual period was 07/24/2018.Marland Kitchen Patient is here for MTX Tx for presumed ectopic pregnancy.  Had virtual visit with Dr. Kennon Rounds earlier today discussing the course of her hCG levels and recommendation for methotrexate.  Since her last visit, the patient is with new complaint of light vaginal bleeding.     ROS Abdominal Pain: Denies Vaginal bleeding: lighter than period.   Passage of clots or tissue: Denies Dizziness: Denies  Conflict (See Lab Report): O POS/O POS Performed at [facility], [address], [place], [place] [address]   Her previous Quantitative HCG values are: Results for BIONCA, MCKEY (MRN [id number]) as of 10/22/2018 19:32  Ref. Range 09/08/2018 14:02 09/10/2018 08:19 09/17/2018 09:44 09/22/2018 09:07 10/05/2018 13:52 10/18/2018 15:07  hCG Quant Latest Units: mIU/mL 609 392 213 211 182 120    Physical Exam   Patient Vitals for the past 24 hrs:  BP Temp Pulse Resp Height Weight  10/22/18 1947 115/70 98.3 F (36.8 C) 68 18 _0  (1.575 m) 68 kg   Constitutional: Well-nourished female in no apparent distress. No pallor Neuro: Alert and oriented 4 Cardiovascular: Normal rate Respiratory: Regular rate and effort Abdomen: Deferred Gynecological Exam: normal external genitalia, vulva, vagina, cervix, uterus and adnexa, examination not indicated  Labs: Results for orders placed or performed during the hospital encounter of 10/22/18 (from the past 24 hour(s))  CBC   Collection Time: 10/22/18  7:52 PM  Result Value Ref Range   WBC 5.7 4.0 - 10.5 K/uL   RBC 4.13 3.87 - 5.11 MIL/uL   Hemoglobin 13.4 12.0 - 15.0 g/dL   HCT 39.6 36.0 - 46.0 %   MCV 95.9 80.0 - 100.0 fL   MCH 32.4 26.0 - 34.0 pg   MCHC 33.8 30.0 - 36.0 g/dL   RDW 12.0 11.5 - 15.5 %   Platelets 314 150 - 400 K/uL   nRBC 0.0 0.0 - 0.2 %     Ultrasound Studies:   NA  MAU course/MDM: Orders Placed This Encounter  Procedures  . Comprehensive metabolic panel  . CBC  . hCG, quantitative, pregnancy   Meds ordered this encounter  Medications  . methotrexate [facility]) chemo injection kit 85 mg   Slow drop in Quant over the last 6 weeks concerning for ectopic pregnancy.  Reviewed with patient that the plateauing of her hCGs, concern for ectopic pregnancy and risks of failure to treat ectopic pregnancy with patient.  Recommend methotrexate treatment.  Patient consents and verbalizes understanding.  The risks of methotrexate were reviewed including failure requiring repeat dosing or eventual surgery. She understands that methotrexate involves frequent return visits to monitor lab values and that she remains at risk of ectopic rupture until her beta is less than assay. ?The patient opts to proceed with methotrexate.  She has no history of hepatic or renal dysfunction, has normal BUN/Cr/LFT's/platelets.  She is felt to be reliable for follow-up. Side effects of photosensitivity & GI upset were discussed.  She knows to avoid direct sunlight and abstain from alcohol, NSAIDs and sexual intercourse for two weeks. She was counseled to discontinue any MVI with folic acid. ?She understands to follow up on D4 (Monday) and D7 (Thursday) for repeat BHCG and was given the instruction sheet. ?Strict ectopic precautions were reviewed, the patient knows to call with any abdominal pain, vomiting, fainting,  or any concerns with her health.   Assessment: 1. Ectopic pregnancy without intrauterine pregnancy, unspecified location     Plan: Discharge home in stable condition. Ectopic precautions Follow-up Information    [facility] Follow up on 10/25/2018.   Specialty: Obstetrics and Gynecology Why: For repeat blood work or sooner as needed in emergencies (severe abdominal pain, heavy bleeding or fever greater than 100.4) Contact  information: [address] [id number] [place] [phone]         Allergies as of 10/22/2018   No Known Allergies     Medication List    STOP taking these medications   oxyCODONE-acetaminophen 5-325 MG tablet Commonly known as: PERCOCET/ROXICET     TAKE these medications   acetaminophen 325 MG tablet Commonly known as: TYLENOL Take by mouth.   oxyCODONE 5 MG immediate release tablet Commonly known as: Oxy IR/ROXICODONE Take 1 tablet (5 mg total) by mouth every 6 (six) hours as needed for severe pain.       Tamala Julian, [place], [place] 10/22/2018, 8:26 PM  2/3

## 2018-10-25 ENCOUNTER — Other Ambulatory Visit: Payer: Self-pay

## 2018-10-25 ENCOUNTER — Inpatient Hospital Stay (HOSPITAL_COMMUNITY)
Admission: AD | Admit: 2018-10-25 | Discharge: 2018-10-25 | Disposition: A | Discharge status: Home / Self Care | Payer: Medicaid Other | Source: Ambulatory Visit | Attending: Obstetrics & Gynecology | Admitting: Obstetrics & Gynecology

## 2018-10-25 DIAGNOSIS — O00101 Right tubal pregnancy without intrauterine pregnancy: Secondary | ICD-10-CM | POA: Insufficient documentation

## 2018-10-25 DIAGNOSIS — O3680X0 Pregnancy with inconclusive fetal viability, not applicable or unspecified: Secondary | ICD-10-CM

## 2018-10-25 LAB — HCG, QUANTITATIVE, PREGNANCY: hCG, Beta Chain, Quant, S: 73 m[IU]/mL — ABNORMAL HIGH (ref <5)

## 2018-10-25 NOTE — MAU Note (Signed)
Pt reports to MAU for f/u HCG. Pt reports no other complaints at this time. Pt in family waiting area for labs.

## 2018-10-25 NOTE — MAU Provider Note (Signed)
Ms. Leah Gray  is a 23 y.o. G2P0010  at [redacted]w[redacted]d who presents to MAU today for follow-up quant hCG. This is day 4 HCG s/p methotrexate.  Denies abdominal pain or vaginal bleeding.    BP 106/63 (BP Location: Right Arm)   Pulse 60   Temp 98.4 F (36.9 C) (Oral)   Resp 17   LMP 07/24/2018   GENERAL: Well-developed, well-nourished female in no acute distress.  HEENT: Normocephalic, atraumatic.   LUNGS: Effort normal HEART: Regular rate  SKIN: Warm, dry and without erythema PSYCH: Normal mood and affect   A: 1. Right tubal pregnancy without intrauterine pregnancy   2. Pregnancy of unknown anatomic location    Appropriate decrease in HCG Component     Latest Ref Rng & Units 10/22/2018 10/25/2018        MTX Day 4  HCG, Beta Chain, Quant, S     <5 mIU/mL 103 (H) 73 (H)    P: Discharge home Return precautions discussed Pt to return to MAU on Thursday for day 7 labs - can't present during clinic hours   Jorje Guild, NP  10/25/2018 8:28 PM

## 2018-10-25 NOTE — MAU Note (Signed)
Pt left without vitals or signing.

## 2018-10-27 ENCOUNTER — Other Ambulatory Visit: Payer: Self-pay

## 2018-10-27 ENCOUNTER — Encounter: Payer: Self-pay | Admitting: Women's Health

## 2018-10-27 ENCOUNTER — Ambulatory Visit (INDEPENDENT_AMBULATORY_CARE_PROVIDER_SITE_OTHER): Payer: Self-pay | Admitting: Women's Health

## 2018-10-27 VITALS — BP 118/78 | Ht 61.0 in | Wt 150.0 lb

## 2018-10-27 DIAGNOSIS — Z01419 Encounter for gynecological examination (general) (routine) without abnormal findings: Secondary | ICD-10-CM

## 2018-10-27 DIAGNOSIS — Z23 Encounter for immunization: Secondary | ICD-10-CM

## 2018-10-27 NOTE — Progress Notes (Signed)
Leah Gray 1995/06/10 [id number]    History:    Presents for annual exam with 08/2018 ectopic pregnancy and 2019 ectopic pregnancy with right salpingectomy. P[facility] evaluation with [facility] began on 5/10 with hcg 1337.4. Quant hcg levels trended downward: 09/08/18 609, 09/10/18 392, 09/17/18 213, 09/22/18 211, 10/05/18 182, 10/18/18 120. Received Methotrexate on 10/22/18. Quant Hcg 73 on 10/25/18. Same partner with negative STD screen. Regular monthly cycles lasting 5-6 days. Has never had pap. Has not had Gardasil. Denies any abdominal pain, abnormal vaginal bleeding, vaginal discharge, burning, itching, urinary sx's, constipation, or diarrhea. O pos.  Past medical history, past surgical history, family history and social history were all reviewed and documented in the EPIC chart. 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 Lives with partner. Mother had breast cancer at age 11,survivor, lives in [place], BRCA status unknown  ROS:  A ROS was performed and pertinent positives and negatives are included.  Exam:  Vitals:   10/27/18 1007  BP: 118/78  Weight: 150 lb (68 kg)  Height: 5' 1" (1.549 m)   Body mass index is 28.34 kg/m.   General appearance:  Normal Respiratory  Auscultation:  Clear without wheezing or rhonchi Cardiovascular  Auscultation:  Regular rate, without rubs, murmurs or gallops  Edema/varicosities:  Not grossly evident Abdominal  Soft,nontender, without masses, guarding or rebound.  Liver/spleen:  No organomegaly noted  Hernia:  None appreciated  Skin  Inspection:  Grossly normal   Breasts: Examined lying and sitting.     Right: Without masses, retractions, discharge or axillary adenopathy.     Left: Without masses, retractions, discharge or axillary adenopathy. Gentitourinary   Inguinal/mons:  Normal without inguinal adenopathy  External genitalia:  Normal  BUS/Urethra/Skene's glands:  Normal  Vagina:  Normal  Cervix:  Normal  Uterus:  normal in size, shape and  contour.  Midline and mobile  Adnexa/parametria:     Rt: Without masses or tenderness.   Lt: Without masses or tenderness.  Anus and perineum: Normal  Assessment/Plan:  23 y.o. SHF G2P0  for annual exam.   08/2018 Ectopic Pregnancy- 6/26 Methotrexate, 6/29 quant hcg 73 and has scheduled follow-up 2019 Ectopic Pregnancy with right salpingectomy Mother history of breast cancer age 16  Plan: Has follow-up with [facility].  Discussed need for hysterosalpingogram, but patient does not currently have insurance. Reviewed options for contraception including OC's, Nexplanon, and IUD. Interested in [place]. Recommended health department for placement, self-pay. Encouraged SBE's, reviewed importance of screening mammogram starting at age 26 due to mother's history, daily exercise, healthy eating habits, and daily multi-vitamin encouraged. Gardasil discussed, first dose given. Will return in 2 and 6 months for other doses. Pap.    [place], 10:20 AM 10/27/2018

## 2018-10-27 NOTE — Addendum Note (Signed)
Addended by: Lorine Bears on: 10/27/2018 12:36 PM   Modules accepted: Orders

## 2018-10-27 NOTE — Addendum Note (Signed)
Addended by: Lorine Bears on: 10/27/2018 12:14 PM   Modules accepted: Orders

## 2018-10-27 NOTE — Patient Instructions (Addendum)
Health Maintenance, Female °Adopting a healthy lifestyle and getting preventive care are important in promoting health and wellness. Ask your health care provider about: °· The right schedule for you to have regular tests and exams. °· Things you can do on your own to prevent diseases and keep yourself healthy. °What should I know about diet, weight, and exercise? °Eat a healthy diet ° °· Eat a diet that includes plenty of vegetables, fruits, low-fat dairy products, and lean protein. °· Do not eat a lot of foods that are high in solid fats, added sugars, or sodium. °Maintain a healthy weight °Body mass index (BMI) is used to identify weight problems. It estimates body fat based on height and weight. Your health care provider can help determine your BMI and help you achieve or maintain a healthy weight. °Get regular exercise °Get regular exercise. This is one of the most important things you can do for your health. Most adults should: °· Exercise for at least 150 minutes each week. The exercise should increase your heart rate and make you sweat (moderate-intensity exercise). °· Do strengthening exercises at least twice a week. This is in addition to the moderate-intensity exercise. °· Spend less time sitting. Even light physical activity can be beneficial. °Watch cholesterol and blood lipids °Have your blood tested for lipids and cholesterol at 25 years of age, then have this test every 5 years. °Have your cholesterol levels checked more often if: °· Your lipid or cholesterol levels are high. °· You are older than 25 years of age. °· You are at high risk for heart disease. °What should I know about cancer screening? °Depending on your health history and family history, you may need to have cancer screening at various ages. This may include screening for: °· Breast cancer. °· Cervical cancer. °· Colorectal cancer. °· Skin cancer. °· Lung cancer. °What should I know about heart disease, diabetes, and high blood  pressure? °Blood pressure and heart disease °· High blood pressure causes heart disease and increases the risk of stroke. This is more likely to develop in people who have high blood pressure readings, are of African descent, or are overweight. °· Have your blood pressure checked: °? Every 3-5 years if you are 18-39 years of age. °? Every year if you are 40 years old or older. °Diabetes °Have regular diabetes screenings. This checks your fasting blood sugar level. Have the screening done: °· Once every three years after age 40 if you are at a normal weight and have a low risk for diabetes. °· More often and at a younger age if you are overweight or have a high risk for diabetes. °What should I know about preventing infection? °Hepatitis B °If you have a higher risk for hepatitis B, you should be screened for this virus. Talk with your health care provider to find out if you are at risk for hepatitis B infection. °Hepatitis C °Testing is recommended for: °· Everyone born from 1945 through 1965. °· Anyone with known risk factors for hepatitis C. °Sexually transmitted infections (STIs) °· Get screened for STIs, including gonorrhea and chlamydia, if: °? You are sexually active and are younger than 24 years of age. °? You are older than 24 years of age and your health care provider tells you that you are at risk for this type of infection. °? Your sexual activity has changed since you were last screened, and you are at increased risk for chlamydia or gonorrhea. Ask your health care provider if   you are at risk. °· Ask your health care provider about whether you are at high risk for HIV. Your health care provider may recommend a prescription medicine to help prevent HIV infection. If you choose to take medicine to prevent HIV, you should first get tested for HIV. You should then be tested every 3 months for as long as you are taking the medicine. °Pregnancy °· If you are about to stop having your period (premenopausal) and  you may become pregnant, seek counseling before you get pregnant. °· Take 400 to 800 micrograms (mcg) of folic acid every day if you become pregnant. °· Ask for birth control (contraception) if you want to prevent pregnancy. °Osteoporosis and menopause °Osteoporosis is a disease in which the bones lose minerals and strength with aging. This can result in bone fractures. If you are 65 years old or older, or if you are at risk for osteoporosis and fractures, ask your health care provider if you should: °· Be screened for bone loss. °· Take a calcium or vitamin D supplement to lower your risk of fractures. °· Be given hormone replacement therapy (HRT) to treat symptoms of menopause. °Follow these instructions at home: °Lifestyle °· Do not use any products that contain nicotine or tobacco, such as cigarettes, e-cigarettes, and chewing tobacco. If you need help quitting, ask your health care provider. °· Do not use street drugs. °· Do not share needles. °· Ask your health care provider for help if you need support or information about quitting drugs. °Alcohol use °· Do not drink alcohol if: °? Your health care provider tells you not to drink. °? You are pregnant, may be pregnant, or are planning to become pregnant. °· If you drink alcohol: °? Limit how much you use to 0-1 drink a day. °? Limit intake if you are breastfeeding. °· Be aware of how much alcohol is in your drink. In the [place] one drink equals one 12 oz bottle of beer (355 mL), one 5 oz glass of wine (148 mL), or one 1½ oz glass of hard liquor (44 mL). °General instructions °· Schedule regular health, dental, and eye exams. °· Stay current with your vaccines. °· Tell your health care provider if: °? You often feel depressed. °? You have ever been abused or do not feel safe at home. °Summary °· Adopting a healthy lifestyle and getting preventive care are important in promoting health and wellness. °· Follow your health care provider's instructions about healthy  diet, exercising, and getting tested or screened for diseases. °· Follow your health care provider's instructions on monitoring your cholesterol and blood pressure. °This information is not intended to replace advice given to you by your health care provider. Make sure you discuss any questions you have with your health care provider. °Document Released: 10/28/2010 Document Revised: 04/07/2018 Document Reviewed: 04/07/2018 °Elsevier Patient Education © 2020 Elsevier Inc. °Etonogestrel implant °What is this medicine? °ETONOGESTREL (et oh noe JES trel) is a contraceptive (birth control) device. It is used to prevent pregnancy. It can be used for up to 3 years. °This medicine may be used for other purposes; ask your health care provider or pharmacist if you have questions. °COMMON BRAND NAME(S): Implanon, Nexplanon °What should I tell my health care provider before I take this medicine? °They need to know if you have any of these conditions: °· abnormal vaginal bleeding °· blood vessel disease or blood clots °· breast, cervical, endometrial, ovarian, liver, or uterine cancer °· diabetes °· gallbladder disease °·   heart disease or recent heart attack °· high blood pressure °· high cholesterol or triglycerides °· kidney disease °· liver disease °· migraine headaches °· seizures °· stroke °· tobacco smoker °· an unusual or allergic reaction to etonogestrel, anesthetics or antiseptics, other medicines, foods, dyes, or preservatives °· pregnant or trying to get pregnant °· breast-feeding °How should I use this medicine? °This device is inserted just under the skin on the inner side of your upper arm by a health care professional. °Talk to your pediatrician regarding the use of this medicine in children. Special care may be needed. °Overdosage: If you think you have taken too much of this medicine contact a poison control center or emergency room at once. °NOTE: This medicine is only for you. Do not share this medicine with  others. °What if I miss a dose? °This does not apply. °What may interact with this medicine? °Do not take this medicine with any of the following medications: °· amprenavir °· fosamprenavir °This medicine may also interact with the following medications: °· acitretin °· aprepitant °· armodafinil °· bexarotene °· bosentan °· carbamazepine °· certain medicines for fungal infections like fluconazole, ketoconazole, itraconazole and voriconazole °· certain medicines to treat hepatitis, HIV or AIDS °· cyclosporine °· felbamate °· griseofulvin °· lamotrigine °· modafinil °· oxcarbazepine °· phenobarbital °· phenytoin °· primidone °· rifabutin °· rifampin °· rifapentine °· St. John's wort °· topiramate °This list may not describe all possible interactions. Give your health care provider a list of all the medicines, herbs, non-prescription drugs, or dietary supplements you use. Also tell them if you smoke, drink alcohol, or use illegal drugs. Some items may interact with your medicine. °What should I watch for while using this medicine? °This product does not protect you against HIV infection (AIDS) or other sexually transmitted diseases. °You should be able to feel the implant by pressing your fingertips over the skin where it was inserted. Contact your doctor if you cannot feel the implant, and use a non-hormonal birth control method (such as condoms) until your doctor confirms that the implant is in place. Contact your doctor if you think that the implant may have broken or become bent while in your arm. °You will receive a user card from your health care provider after the implant is inserted. The card is a record of the location of the implant in your upper arm and when it should be removed. Keep this card with your health records. °What side effects may I notice from receiving this medicine? °Side effects that you should report to your doctor or health care professional as soon as possible: °· allergic reactions like  skin rash, itching or hives, swelling of the face, lips, or tongue °· breast lumps, breast tissue changes, or discharge °· breathing problems °· changes in emotions or moods °· if you feel that the implant may have broken or bent while in your arm °· high blood pressure °· pain, irritation, swelling, or bruising at the insertion site °· scar at site of insertion °· signs of infection at the insertion site such as fever, and skin redness, pain or discharge °· signs and symptoms of a blood clot such as breathing problems; changes in vision; chest pain; severe, sudden headache; pain, swelling, warmth in the leg; trouble speaking; sudden numbness or weakness of the face, arm or leg °· signs and symptoms of liver injury like dark yellow or brown urine; general ill feeling or flu-like symptoms; light-colored stools; loss of appetite; nausea; right   upper belly pain; unusually weak or tired; yellowing of the eyes or skin °· unusual vaginal bleeding, discharge °Side effects that usually do not require medical attention (report to your doctor or health care professional if they continue or are bothersome): °· acne °· breast pain or tenderness °· headache °· irregular menstrual bleeding °· nausea °This list may not describe all possible side effects. Call your doctor for medical advice about side effects. You may report side effects to FDA at 1-800-FDA-1088. °Where should I keep my medicine? °This drug is given in a hospital or clinic and will not be stored at home. °NOTE: This sheet is a summary. It may not cover all possible information. If you have questions about this medicine, talk to your doctor, pharmacist, or health care provider. °© 2020 Elsevier/Gold Standard (2017-03-03 14:11:42) ° °

## 2018-10-28 ENCOUNTER — Encounter: Payer: Self-pay | Admitting: Women's Health

## 2018-10-28 LAB — PAP IG W/ RFLX HPV ASCU

## 2019-01-05 ENCOUNTER — Ambulatory Visit: Payer: Self-pay

## 2019-01-05 ENCOUNTER — Other Ambulatory Visit: Payer: Self-pay

## 2019-01-11 ENCOUNTER — Other Ambulatory Visit: Payer: Self-pay

## 2019-01-12 ENCOUNTER — Encounter: Payer: Self-pay | Admitting: Women's Health

## 2019-01-12 ENCOUNTER — Ambulatory Visit (INDEPENDENT_AMBULATORY_CARE_PROVIDER_SITE_OTHER): Payer: Self-pay | Admitting: Women's Health

## 2019-01-12 VITALS — BP 122/80

## 2019-01-12 DIAGNOSIS — O3680X0 Pregnancy with inconclusive fetal viability, not applicable or unspecified: Secondary | ICD-10-CM

## 2019-01-12 DIAGNOSIS — N898 Other specified noninflammatory disorders of vagina: Secondary | ICD-10-CM

## 2019-01-12 DIAGNOSIS — N912 Amenorrhea, unspecified: Secondary | ICD-10-CM | POA: Diagnosis not present

## 2019-01-12 LAB — PREGNANCY, URINE: Preg Test, Ur: POSITIVE — AB

## 2019-01-12 LAB — WET PREP FOR TRICH, YEAST, CLUE

## 2019-01-12 MED ORDER — METRONIDAZOLE 500 MG PO TABS: 500.0000 mg | ORAL_TABLET | Freq: Two times a day (BID) | ORAL | 0 refills | Status: DC

## 2019-01-12 NOTE — Progress Notes (Signed)
23 year old SHF G1 P0 presents with home positive UPT.  08/2018 ectopic treated with methotrexate.  Unsure of first day of last cycle, thinks it was the first week of August.  Denies any spotting, abdominal pain, urinary symptoms, or fever.  Reports discharge with odor.  Same partner with negative STD screen.  Happy with pregnancy.  No known health problems.  Exam: Appears well.  UPT positive.  No CVAT, abdomen soft, nontender, external genitalia within normal limits, speculum exam moderate amount of white discharge noted, no visible blood, wet prep positive for clues, TNTC bacteria.  Bimanual no CMT or adnexal tenderness, uterus small.  Early first trimester pregnancy 08/2018 ectopic pregnancy treated with methotrexate Bacterial vaginosis  Plan: Flagyl 500 twice daily for 7 days, alcohol precautions reviewed.  Continue prenatal vitamin daily, safe pregnancy behaviors discussed.  Quantitative hCG today repeat in 2 days.  Ultrasound next week.

## 2019-01-12 NOTE — Addendum Note (Signed)
Addended by: Lorine Bears on: 01/12/2019 12:20 PM   Modules accepted: Orders

## 2019-01-12 NOTE — Patient Instructions (Addendum)
First Trimester of Pregnancy The first trimester of pregnancy is from week 1 until the end of week 13 (months 1 through 3). A week after a sperm fertilizes an egg, the egg will implant on the wall of the uterus. This embryo will begin to develop into a baby. Genes from you and your partner will form the baby. The female genes will determine whether the baby will be a boy or a girl. At 6-8 weeks, the eyes and face will be formed, and the heartbeat can be seen on ultrasound. At the end of 12 weeks, all the baby's organs will be formed. Now that you are pregnant, you will want to do everything you can to have a healthy baby. Two of the most important things are to get good prenatal care and to follow your health care provider's instructions. Prenatal care is all the medical care you receive before the baby's birth. This care will help prevent, find, and treat any problems during the pregnancy and childbirth. Body changes during your first trimester Your body goes through many changes during pregnancy. The changes vary from woman to woman.  You may gain or lose a couple of pounds at first.  You may feel sick to your stomach (nauseous) and you may throw up (vomit). If the vomiting is uncontrollable, call your health care provider.  You may tire easily.  You may develop headaches that can be relieved by medicines. All medicines should be approved by your health care provider.  You may urinate more often. Painful urination may mean you have a bladder infection.  You may develop heartburn as a result of your pregnancy.  You may develop constipation because certain hormones are causing the muscles that push stool through your intestines to slow down.  You may develop hemorrhoids or swollen veins (varicose veins).  Your breasts may begin to grow larger and become tender. Your nipples may stick out more, and the tissue that surrounds them (areola) may become darker.  Your gums may bleed and may be  sensitive to brushing and flossing.  Dark spots or blotches (chloasma, mask of pregnancy) may develop on your face. This will likely fade after the baby is born.  Your menstrual periods will stop.  You may have a loss of appetite.  You may develop cravings for certain kinds of food.  You may have changes in your emotions from day to day, such as being excited to be pregnant or being concerned that something may go wrong with the pregnancy and baby.  You may have more vivid and strange dreams.  You may have changes in your hair. These can include thickening of your hair, rapid growth, and changes in texture. Some women also have hair loss during or after pregnancy, or hair that feels dry or thin. Your hair will most likely return to normal after your baby is born. What to expect at prenatal visits During a routine prenatal visit:  You will be weighed to make sure you and the baby are growing normally.  Your blood pressure will be taken.  Your abdomen will be measured to track your baby's growth.  The fetal heartbeat will be listened to between weeks 10 and 14 of your pregnancy.  Test results from any previous visits will be discussed. Your health care provider may ask you:  How you are feeling.  If you are feeling the baby move.  If you have had any abnormal symptoms, such as leaking fluid, bleeding, severe headaches, or abdominal   cramping.  If you are using any tobacco products, including cigarettes, chewing tobacco, and electronic cigarettes.  If you have any questions. Other tests that may be performed during your first trimester include:  Blood tests to find your blood type and to check for the presence of any previous infections. The tests will also be used to check for low iron levels (anemia) and protein on red blood cells (Rh antibodies). Depending on your risk factors, or if you previously had diabetes during pregnancy, you may have tests to check for high blood sugar  that affects pregnant women (gestational diabetes).  Urine tests to check for infections, diabetes, or protein in the urine.  An ultrasound to confirm the proper growth and development of the baby.  Fetal screens for spinal cord problems (spina bifida) and Down syndrome.  HIV (human immunodeficiency virus) testing. Routine prenatal testing includes screening for HIV, unless you choose not to have this test.  You may need other tests to make sure you and the baby are doing well. Follow these instructions at home: Medicines  Follow your health care provider's instructions regarding medicine use. Specific medicines may be either safe or unsafe to take during pregnancy.  Take a prenatal vitamin that contains at least 600 micrograms (mcg) of folic acid.  If you develop constipation, try taking a stool softener if your health care provider approves. Eating and drinking   Eat a balanced diet that includes fresh fruits and vegetables, whole grains, good sources of protein such as meat, eggs, or tofu, and low-fat dairy. Your health care provider will help you determine the amount of weight gain that is right for you.  Avoid raw meat and uncooked cheese. These carry germs that can cause birth defects in the baby.  Eating four or five small meals rather than three large meals a day may help relieve nausea and vomiting. If you start to feel nauseous, eating a few soda crackers can be helpful. Drinking liquids between meals, instead of during meals, also seems to help ease nausea and vomiting.  Limit foods that are high in fat and processed sugars, such as fried and sweet foods.  To prevent constipation: ? Eat foods that are high in fiber, such as fresh fruits and vegetables, whole grains, and beans. ? Drink enough fluid to keep your urine clear or pale yellow. Activity  Exercise only as directed by your health care provider. Most women can continue their usual exercise routine during  pregnancy. Try to exercise for 30 minutes at least 5 days a week. Exercising will help you: ? Control your weight. ? Stay in shape. ? Be prepared for labor and delivery.  Experiencing pain or cramping in the lower abdomen or lower back is a good sign that you should stop exercising. Check with your health care provider before continuing with normal exercises.  Try to avoid standing for long periods of time. Move your legs often if you must stand in one place for a long time.  Avoid heavy lifting.  Wear low-heeled shoes and practice good posture.  You may continue to have sex unless your health care provider tells you not to. Relieving pain and discomfort  Wear a good support bra to relieve breast tenderness.  Take warm sitz baths to soothe any pain or discomfort caused by hemorrhoids. Use hemorrhoid cream if your health care provider approves.  Rest with your legs elevated if you have leg cramps or low back pain.  If you develop varicose veins in   your legs, wear support hose. Elevate your feet for 15 minutes, 3-4 times a day. Limit salt in your diet. Prenatal care  Schedule your prenatal visits by the twelfth week of pregnancy. They are usually scheduled monthly at first, then more often in the last 2 months before delivery.  Write down your questions. Take them to your prenatal visits.  Keep all your prenatal visits as told by your health care provider. This is important. Safety  Wear your seat belt at all times when driving.  Make a list of emergency phone numbers, including numbers for family, friends, the hospital, and police and fire departments. General instructions  Ask your health care provider for a referral to a local prenatal education class. Begin classes no later than the beginning of month 6 of your pregnancy.  Ask for help if you have counseling or nutritional needs during pregnancy. Your health care provider can offer advice or refer you to specialists for help  with various needs.  Do not use hot tubs, steam rooms, or saunas.  Do not douche or use tampons or scented sanitary pads.  Do not cross your legs for long periods of time.  Avoid cat litter boxes and soil used by cats. These carry germs that can cause birth defects in the baby and possibly loss of the fetus by miscarriage or stillbirth.  Avoid all smoking, herbs, alcohol, and medicines not prescribed by your health care provider. Chemicals in these products affect the formation and growth of the baby.  Do not use any products that contain nicotine or tobacco, such as cigarettes and e-cigarettes. If you need help quitting, ask your health care provider. You may receive counseling support and other resources to help you quit.  Schedule a dentist appointment. At home, brush your teeth with a soft toothbrush and be gentle when you floss. Contact a health care provider if:  You have dizziness.  You have mild pelvic cramps, pelvic pressure, or nagging pain in the abdominal area.  You have persistent nausea, vomiting, or diarrhea.  You have a bad smelling vaginal discharge.  You have pain when you urinate.  You notice increased swelling in your face, hands, legs, or ankles.  You are exposed to fifth disease or chickenpox.  You are exposed to [place] measles (rubella) and have never had it. Get help right away if:  You have a fever.  You are leaking fluid from your vagina.  You have spotting or bleeding from your vagina.  You have severe abdominal cramping or pain.  You have rapid weight gain or loss.  You vomit blood or material that looks like coffee grounds.  You develop a severe headache.  You have shortness of breath.  You have any kind of trauma, such as from a fall or a car accident. Summary  The first trimester of pregnancy is from week 1 until the end of week 13 (months 1 through 3).  Your body goes through many changes during pregnancy. The changes vary from  woman to woman.  You will have routine prenatal visits. During those visits, your health care provider will examine you, discuss any test results you may have, and talk with you about how you are feeling. This information is not intended to replace advice given to you by your health care provider. Make sure you discuss any questions you have with your health care provider. Document Released: 04/08/2001 Document Revised: 03/27/2017 Document Reviewed: 03/26/2016 Elsevier Patient Education  2020 [employer]  Bacterial Vaginosis  Bacterial vaginosis is an infection of the vagina. It happens when too many normal germs (healthy bacteria) grow in the vagina. This infection puts you at risk for infections from sex (STIs). Treating this infection can lower your risk for some STIs. You should also treat this if you are pregnant. It can cause your baby to be born early. Follow these instructions at home: Medicines  Take over-the-counter and prescription medicines only as told by your doctor.  Take or use your antibiotic medicine as told by your doctor. Do not stop taking or using it even if you start to feel better. General instructions  If you your sexual partner is a woman, tell her that you have this infection. She needs to get treatment if she has symptoms. If you have a female partner, he does not need to be treated.  During treatment: ? Avoid sex. ? Do not douche. ? Avoid alcohol as told. ? Avoid breastfeeding as told.  Drink enough fluid to keep your pee (urine) clear or pale yellow.  Keep your vagina and butt (rectum) clean. ? Wash the area with warm water every day. ? Wipe from front to back after you use the toilet.  Keep all follow-up visits as told by your doctor. This is important. Preventing this condition  Do not douche.  Use only warm water to wash around your vagina.  Use protection when you have sex. This includes: ? Latex condoms. ? Dental dams.  Limit how many  people you have sex with. It is best to only have sex with the same person (be monogamous).  Get tested for STIs. Have your partner get tested.  Wear underwear that is cotton or lined with cotton.  Avoid tight pants and pantyhose. This is most important in summer.  Do not use any products that have nicotine or tobacco in them. These include cigarettes and e-cigarettes. If you need help quitting, ask your doctor.  Do not use illegal drugs.  Limit how much alcohol you drink. Contact a doctor if:  Your symptoms do not get better, even after you are treated.  You have more discharge or pain when you pee (urinate).  You have a fever.  You have pain in your belly (abdomen).  You have pain with sex.  Your bleed from your vagina between periods. Summary  This infection happens when too many germs (bacteria) grow in the vagina.  Treating this condition can lower your risk for some infections from sex (STIs).  You should also treat this if you are pregnant. It can cause early (premature) birth.  Do not stop taking or using your antibiotic medicine even if you start to feel better. This information is not intended to replace advice given to you by your health care provider. Make sure you discuss any questions you have with your health care provider. Document Released: 01/22/2008 Document Revised: 03/27/2017 Document Reviewed: 12/29/2015 Elsevier Patient Education  2020 [employer].

## 2019-01-14 ENCOUNTER — Other Ambulatory Visit: Payer: Self-pay

## 2019-01-14 DIAGNOSIS — N912 Amenorrhea, unspecified: Secondary | ICD-10-CM

## 2019-01-15 LAB — HCG, QUANTITATIVE, PREGNANCY: HCG, Total, QN: 117076 m[IU]/mL

## 2019-01-18 ENCOUNTER — Encounter: Payer: Self-pay | Admitting: Women's Health

## 2019-01-20 ENCOUNTER — Other Ambulatory Visit: Payer: Self-pay

## 2019-01-20 ENCOUNTER — Other Ambulatory Visit: Payer: Self-pay | Admitting: *Deleted

## 2019-01-20 DIAGNOSIS — N912 Amenorrhea, unspecified: Secondary | ICD-10-CM

## 2019-01-20 LAB — HCG, QUANTITATIVE, PREGNANCY: HCG, Total, QN: 101346 m[IU]/mL

## 2019-01-21 ENCOUNTER — Encounter (HOSPITAL_COMMUNITY): Payer: Self-pay

## 2019-01-21 ENCOUNTER — Inpatient Hospital Stay (HOSPITAL_COMMUNITY): Payer: Medicaid Other

## 2019-01-21 ENCOUNTER — Other Ambulatory Visit: Payer: Self-pay

## 2019-01-21 ENCOUNTER — Inpatient Hospital Stay (HOSPITAL_COMMUNITY)
Admission: AD | Admit: 2019-01-21 | Discharge: 2019-01-21 | Disposition: A | Discharge status: Home / Self Care | Payer: Medicaid Other | Attending: Obstetrics & Gynecology | Admitting: Obstetrics & Gynecology

## 2019-01-21 DIAGNOSIS — Z87891 Personal history of nicotine dependence: Secondary | ICD-10-CM | POA: Diagnosis not present

## 2019-01-21 DIAGNOSIS — Z3A08 8 weeks gestation of pregnancy: Secondary | ICD-10-CM | POA: Insufficient documentation

## 2019-01-21 DIAGNOSIS — R1033 Periumbilical pain: Secondary | ICD-10-CM | POA: Insufficient documentation

## 2019-01-21 DIAGNOSIS — O26891 Other specified pregnancy related conditions, first trimester: Secondary | ICD-10-CM | POA: Diagnosis not present

## 2019-01-21 DIAGNOSIS — R109 Unspecified abdominal pain: Secondary | ICD-10-CM | POA: Diagnosis present

## 2019-01-21 DIAGNOSIS — Z3A14 14 weeks gestation of pregnancy: Secondary | ICD-10-CM

## 2019-01-21 DIAGNOSIS — O26899 Other specified pregnancy related conditions, unspecified trimester: Secondary | ICD-10-CM

## 2019-01-21 DIAGNOSIS — O30031 Twin pregnancy, monochorionic/diamniotic, first trimester: Secondary | ICD-10-CM

## 2019-01-21 LAB — CBC
HCT: 33.7 % — ABNORMAL LOW (ref 36.0–46.0)
Hemoglobin: 11.9 g/dL — ABNORMAL LOW (ref 12.0–15.0)
MCH: 33.4 pg (ref 26.0–34.0)
MCHC: 35.3 g/dL (ref 30.0–36.0)
MCV: 94.7 fL (ref 80.0–100.0)
Platelets: 262 10*3/uL (ref 150–400)
RBC: 3.56 MIL/uL — ABNORMAL LOW (ref 3.87–5.11)
RDW: 11.9 % (ref 11.5–15.5)
WBC: 8.5 10*3/uL (ref 4.0–10.5)
nRBC: 0 % (ref 0.0–0.2)

## 2019-01-21 LAB — URINALYSIS, ROUTINE W REFLEX MICROSCOPIC
Bilirubin Urine: NEGATIVE
Glucose, UA: 50 mg/dL — AB
Hgb urine dipstick: NEGATIVE
Ketones, ur: NEGATIVE mg/dL
Leukocytes,Ua: NEGATIVE
Nitrite: NEGATIVE
Protein, ur: NEGATIVE mg/dL
Specific Gravity, Urine: 1.011 (ref 1.005–1.030)
pH: 7 (ref 5.0–8.0)

## 2019-01-21 LAB — HCG, QUANTITATIVE, PREGNANCY: hCG, Beta Chain, Quant, S: 94340 m[IU]/mL — ABNORMAL HIGH (ref <5)

## 2019-01-21 NOTE — [facility] Provider Note (Signed)
History     CSN: [id number]  Arrival date and time: 01/21/19 1807   First Provider Initiated Contact with Patient 01/21/19 1958      Chief Complaint  Patient presents with  . Abdominal Pain   HPI Leah Gray is a 23 y.o. G3P0020 at [redacted]w[redacted]d by LMP who presents to [facility] with chief complaint of abdominal pain. This is a new problem, onset last night. Patient endorses bilateral pain in the middle of her abdomen as well as about 2cm below her umbilicus. Her pain waxes and wanes between 2/10 and 5/10. She denies aggravating or alleviating factors. She has not taken medication or tried other treatments for this complaint.  Patient's OB history is significant for decreasing quant hCG this pregnancy, nonviable pregnancy with Methotrexate administration in 09/2018 and right laparoscopic salpingectomy for ruptured ectopic 09/05/2017.  OB History    Gravida  3   Para      Term      Preterm      AB  2   Living        SAB  1   TAB      Ectopic  1   Multiple      Live Births              Past Medical History:  Diagnosis Date  . Closed bimalleolar fracture of left ankle 04/14/2012  . Medical history non-contributory     Past Surgical History:  Procedure Laterality Date  . ANKLE FRACTURE SURGERY  2014   bilateral   . ANKLE SURGERY    . LAPAROSCOPY Right 09/05/2017   Procedure: LAPAROSCOPY OPERATIVE WITH RIGHT SALPINGECTOMY;  Surgeon: Donnamae Jude, MD;  Location: [facility];  Service: Gynecology;  Laterality: Right;    Family History  Problem Relation Age of Onset  . Asthma Mother   . Cancer Mother   . Miscarriages / Stillbirths Maternal Aunt   . COPD Maternal Grandmother   . Anxiety disorder Maternal Grandfather   . Depression Maternal Grandfather   . Diabetes Maternal Grandfather     Social History   Tobacco Use  . Smoking status: Former Smoker    Quit date: 10/27/2015    Years since quitting: 3.2  . Smokeless tobacco: Never Used  Substance Use Topics  .  Alcohol use: Yes    Comment: social   . Drug use: Never    Allergies: No Known Allergies  Medications Prior to Admission  Medication Sig Dispense Refill Last Dose  . acetaminophen (TYLENOL) 325 MG tablet Take by mouth.     . metroNIDAZOLE (FLAGYL) 500 MG tablet Take 1 tablet (500 mg total) by mouth 2 (two) times daily. 14 tablet 0     Review of Systems  Constitutional: Negative for chills, fatigue and fever.  Gastrointestinal: Positive for abdominal pain.  Genitourinary: Negative for vaginal bleeding.  Musculoskeletal: Negative for back pain.  All other systems reviewed and are negative.  Physical Exam   Blood pressure 115/66, pulse 97, resp. rate 16, height 5\' 1"  (1.549 m), weight 70.9 kg, last menstrual period 11/20/2018, SpO2 100 %.  Physical Exam  Nursing note and vitals reviewed. Constitutional: She is oriented to person, place, and time. She appears well-developed and well-nourished.  Cardiovascular: Normal rate.  Respiratory: Effort normal. No respiratory distress.  GI: Soft. She exhibits no distension. There is no abdominal tenderness. There is no rebound, no guarding and no CVA tenderness.  Musculoskeletal: Normal range of motion.  Neurological: She is alert and oriented  to person, place, and time.  Skin: Skin is warm and dry.  Psychiatric: She has a normal mood and affect. Her behavior is normal. Judgment and thought content normal.    MAU Course/MDM  Procedures  Patient Vitals for the past 24 hrs:  BP Temp src Pulse Resp SpO2 Height Weight  01/21/19 2126 134/79 - 95 18 - - -  01/21/19 1933 115/66 Oral 97 16 100 % 5\' 1"  (1.549 m) 70.9 kg   Results for orders placed or performed during the hospital encounter of 01/21/19 (from the past 24 hour(s))  Urinalysis, Routine w reflex microscopic     Status: Abnormal   Collection Time: 01/21/19  7:48 PM  Result Value Ref Range   Color, Urine YELLOW YELLOW   APPearance CLEAR CLEAR   Specific Gravity, Urine 1.011 1.005  - 1.030   pH 7.0 5.0 - 8.0   Glucose, UA 50 (A) NEGATIVE mg/dL   Hgb urine dipstick NEGATIVE NEGATIVE   Bilirubin Urine NEGATIVE NEGATIVE   Ketones, ur NEGATIVE NEGATIVE mg/dL   Protein, ur NEGATIVE NEGATIVE mg/dL   Nitrite NEGATIVE NEGATIVE   Leukocytes,Ua NEGATIVE NEGATIVE  CBC     Status: Abnormal   Collection Time: 01/21/19  8:25 PM  Result Value Ref Range   WBC 8.5 4.0 - 10.5 K/uL   RBC 3.56 (L) 3.87 - 5.11 MIL/uL   Hemoglobin 11.9 (L) 12.0 - 15.0 g/dL   HCT 01/23/19 (L) 01.0 - 27.2 %   MCV 94.7 80.0 - 100.0 fL   MCH 33.4 26.0 - 34.0 pg   MCHC 35.3 30.0 - 36.0 g/dL   RDW 53.6 64.4 - 03.4 %   Platelets 262 150 - 400 K/uL   nRBC 0.0 0.0 - 0.2 %  hCG, quantitative, pregnancy     Status: Abnormal   Collection Time: 01/21/19  8:25 PM  Result Value Ref Range   hCG, Beta Chain, Quant, S 94,340 (H) <5 mIU/mL   Assessment and Plan  --23 y.o. 22 with IUP at [redacted]w[redacted]d with mono/di twin gestation  --Pt declines rx for prenatal vitamin --Discharge home in stable condition  F/U: --Pt has appt with [facility] Monday 01/24/19 --Discussed typical MFM appt at 16 weeks  01/26/19, CNM 01/21/2019, 10:26 PM

## 2019-01-21 NOTE — Progress Notes (Signed)
thanks

## 2019-01-21 NOTE — Discharge Instructions (Signed)
Multiple Pregnancy °Having a multiple pregnancy means that a woman is carrying more than one baby at a time. She may be pregnant with twins, triplets, or more. The majority of multiple pregnancies are twins. Naturally conceiving triplets or more (higher-order multiples) is rare. °Multiple pregnancies are riskier than single pregnancies. A woman with a multiple pregnancy is more likely to have certain problems during her pregnancy. Therefore, she will need to have more frequent appointments for prenatal care. °How does a multiple pregnancy happen? °A multiple pregnancy happens when: °· The woman's body releases more than one egg at a time, and then each egg gets fertilized by a different sperm. °? This is the most common type of multiple pregnancy. °? Twins or other multiples produced this way are fraternal. They are no more alike than non-multiple siblings are. °· One sperm fertilizes one egg, which then divides into more than one embryo. °? Twins or other multiples produced this way are identical. Identical multiples are always the same gender, and they look very much alike. °Who is most likely to have a multiple pregnancy? °A multiple pregnancy is more likely to develop in women who: °· Have had fertility treatment, especially if the treatment included fertility drugs. °· Are older than 25 years of age. °· Have already had four or more children. °· Have a family history of multiple pregnancy. °How is a multiple pregnancy diagnosed? °A multiple pregnancy may be diagnosed based on: °· Symptoms such as: °? Rapid weight gain in the first 3 months of pregnancy (first trimester). °? More severe nausea and breast tenderness than what is typical of a single pregnancy. °? The uterus measuring larger than what is normal for the stage of the pregnancy. °· Blood tests that detect a higher-than-normal level of human chorionic gonadotropin (hCG). This is a hormone that your body produces in early pregnancy. °· Ultrasound exam.  This is used to confirm that you are carrying multiples. °What risks are associated with multiple pregnancy? °A multiple pregnancy puts you at a higher risk for certain problems during or after your pregnancy, including: °· Having your babies delivered before you have reached a full-term pregnancy (preterm birth). A full-term pregnancy lasts for at least 37 weeks. Babies born before 37 weeks may have a higher risk of a variety of health problems, such as breathing problems, feeding difficulties, cerebral palsy, and learning disabilities. °· Diabetes. °· Preeclampsia. This is a serious condition that causes high blood pressure along with other symptoms, such as swelling and headaches, during pregnancy. °· Excessive blood loss after childbirth (postpartum hemorrhage). °· Postpartum depression. °· Low birth weight of the babies. °How will having a multiple pregnancy affect my care? °Your health care provider will want to monitor you more closely during your pregnancy to make sure that your babies are growing normally and that you are healthy. °Follow these instructions at home: °Because your pregnancy is considered to be high risk, you will need to work closely with your health care team. You may also need to make some lifestyle changes. These may include the following: °Eating and drinking °· Increase your nutrition. °? Follow your health care provider’s recommendations for weight gain. You may need to gain a little extra weight when you are pregnant with multiples. °? Eat healthy snacks often throughout the day. This can add calories and reduce nausea. °· Drink enough fluid to keep your urine pale yellow. °· Take prenatal vitamins. °Activity °By 20-24 weeks, you may need to limit your activities. °·   Avoid activities and work that take a lot of effort (are strenuous). °· Ask your health care provider when you should stop having sexual intercourse. °· Rest often. °General instructions °· Do not use any products that  contain nicotine or tobacco, such as cigarettes and e-cigarettes. If you need help quitting, ask your health care provider. °· Do not drink alcohol or use illegal drugs. °· Take over-the-counter and prescription medicines only as told by your health care provider. °· Arrange for extra help around the house. °· Keep all follow-up visits and all prenatal visits as told by your health care provider. This is important. °Contact a health care provider if: °· You have dizziness. °· You have persistent nausea, vomiting, or diarrhea. °· You are having trouble gaining weight. °· You have feelings of depression or other emotions that are interfering with your normal activities. °Get help right away if: °· You have a fever. °· You have pain with urination. °· You have fluid leaking from your vagina. °· You have a bad-smelling vaginal discharge. °· You notice increased swelling in your face, hands, legs, or ankles. °· You have spotting or bleeding from your vagina. °· You have pelvic cramps, pelvic pressure, or nagging pain in your abdomen or lower back. °· You are having regular contractions. °· You develop a severe headache, with or without visual changes. °· You have shortness of breath or chest pain. °· You notice less fetal movement, or no fetal movement. °Summary °· Having a multiple pregnancy means that a woman is carrying more than one baby at a time. °· A multiple pregnancy puts you at a higher risk for certain problems during and after your pregnancy, such as: having your babies delivered before you have reached a full-term pregnancy (preterm birth), diabetes, preeclampsia, excessive blood loss after childbirth (postpartum hemorrhage), postpartum depression, or low birth weight of the babies. °· Your health care provider will want to monitor you more closely during your pregnancy to make sure that your babies are growing normally and that you are healthy. °· You may need to make some lifestyle changes during  pregnancy, including: increasing your nutrition, limiting your activities after 20-24 weeks of pregnancy, and arranging for extra help around the house. °This information is not intended to replace advice given to you by your health care provider. Make sure you discuss any questions you have with your health care provider. °Document Released: 01/22/2008 Document Revised: 08/05/2018 Document Reviewed: 12/14/2015 °Elsevier Patient Education © 2020 [employer] ° °

## 2019-01-21 NOTE — MAU Note (Addendum)
Pt reports sharp abdominal pain that comes and goes. Reports the pain is sometimes in both sides and at other times it is in just left side. Worse with walking. Has tried hot showers to help pain, which helps some. Pt denies vaginal bleeding or discharge. Reports that her hormone levels in the office are decreasing. Last had a check yesterday and it was 101. The previous time it was 117. Worried about ectopic as she has had one in the past. Had right tube removed in 2019.

## 2019-01-24 ENCOUNTER — Ambulatory Visit (INDEPENDENT_AMBULATORY_CARE_PROVIDER_SITE_OTHER): Payer: Self-pay | Admitting: Women's Health

## 2019-01-24 ENCOUNTER — Other Ambulatory Visit: Payer: Self-pay

## 2019-01-24 ENCOUNTER — Encounter: Payer: Self-pay | Admitting: Women's Health

## 2019-01-24 ENCOUNTER — Ambulatory Visit: Payer: Self-pay | Admitting: Women's Health

## 2019-01-24 VITALS — BP 126/82

## 2019-01-24 DIAGNOSIS — O3680X2 Pregnancy with inconclusive fetal viability, fetus 2: Secondary | ICD-10-CM

## 2019-01-24 DIAGNOSIS — Z3492 Encounter for supervision of normal pregnancy, unspecified, second trimester: Secondary | ICD-10-CM

## 2019-01-24 DIAGNOSIS — O3680X1 Pregnancy with inconclusive fetal viability, fetus 1: Secondary | ICD-10-CM

## 2019-01-24 NOTE — Progress Notes (Signed)
23 year old SHF G3 P0 presents with several questions was originally scheduled for viability/dating ultrasound but had [place] over the weekend at [facility].  History of an ectopic.  Had abnormal quantitative hCG with some abdominal cramping and no bleeding.  Taking prenatal vitamin daily.  Denies spotting, bleeding, cramping, nausea, urinary symptoms or vaginal discharge.  No known medical problems.  Ultrasound.  01/21/2019 showed IUP,  monochrorionic diamiotic twins with positive fetal heart rates at 13 weeks 4 days.   Exam: Appears well.  Second trimester IUP twins  Plan: Copy of ultrasound and Pap given to take to first prenatal appointment.  Instructed to schedule new OB appointment, in process of getting OB Medicaid.  Safe pregnancy behaviors discussed, continue prenatal vitamin daily, healthy lifestyle and diet discussed.  Mother lives in [place] and may be able to come to help after twins delivered.  Congratulations given.

## 2019-01-24 NOTE — Patient Instructions (Signed)
Multiple Pregnancy Having a multiple pregnancy means that a woman is carrying more than one baby at a time. She may be pregnant with twins, triplets, or more. The majority of multiple pregnancies are twins. Naturally conceiving triplets or more (higher-order multiples) is rare. Multiple pregnancies are riskier than single pregnancies. A woman with a multiple pregnancy is more likely to have certain problems during her pregnancy. Therefore, she will need to have more frequent appointments for prenatal care. How does a multiple pregnancy happen? A multiple pregnancy happens when:  The woman's body releases more than one egg at a time, and then each egg gets fertilized by a different sperm. ? This is the most common type of multiple pregnancy. ? Twins or other multiples produced this way are fraternal. They are no more alike than non-multiple siblings are.  One sperm fertilizes one egg, which then divides into more than one embryo. ? Twins or other multiples produced this way are identical. Identical multiples are always the same gender, and they look very much alike. Who is most likely to have a multiple pregnancy? A multiple pregnancy is more likely to develop in women who:  Have had fertility treatment, especially if the treatment included fertility drugs.  Are older than 25 years of age.  Have already had four or more children.  Have a family history of multiple pregnancy. How is a multiple pregnancy diagnosed? A multiple pregnancy may be diagnosed based on:  Symptoms such as: ? Rapid weight gain in the first 3 months of pregnancy (first trimester). ? More severe nausea and breast tenderness than what is typical of a single pregnancy. ? The uterus measuring larger than what is normal for the stage of the pregnancy.  Blood tests that detect a higher-than-normal level of human chorionic gonadotropin (hCG). This is a hormone that your body produces in early pregnancy.  Ultrasound exam.  This is used to confirm that you are carrying multiples. What risks are associated with multiple pregnancy? A multiple pregnancy puts you at a higher risk for certain problems during or after your pregnancy, including:  Having your babies delivered before you have reached a full-term pregnancy (preterm birth). A full-term pregnancy lasts for at least 37 weeks. Babies born before 75 weeks may have a higher risk of a variety of health problems, such as breathing problems, feeding difficulties, cerebral palsy, and learning disabilities.  Diabetes.  Preeclampsia. This is a serious condition that causes high blood pressure along with other symptoms, such as swelling and headaches, during pregnancy.  Excessive blood loss after childbirth (postpartum hemorrhage).  Postpartum depression.  Low birth weight of the babies. How will having a multiple pregnancy affect my care? Your health care provider will want to monitor you more closely during your pregnancy to make sure that your babies are growing normally and that you are healthy. Follow these instructions at home: Because your pregnancy is considered to be high risk, you will need to work closely with your health care team. You may also need to make some lifestyle changes. These may include the following: Eating and drinking  Increase your nutrition. ? Follow your health care provider's recommendations for weight gain. You may need to gain a little extra weight when you are pregnant with multiples. ? Eat healthy snacks often throughout the day. This can add calories and reduce nausea.  Drink enough fluid to keep your urine pale yellow.  Take prenatal vitamins. Activity By 20-24 weeks, you may need to limit your activities.  Avoid activities and work that take a lot of effort (are strenuous). °· Ask your health care provider when you should stop having sexual intercourse. °· Rest often. °General instructions °· Do not use any products that  contain nicotine or tobacco, such as cigarettes and e-cigarettes. If you need help quitting, ask your health care provider. °· Do not drink alcohol or use illegal drugs. °· Take over-the-counter and prescription medicines only as told by your health care provider. °· Arrange for extra help around the house. °· Keep all follow-up visits and all prenatal visits as told by your health care provider. This is important. °Contact a health care provider if: °· You have dizziness. °· You have persistent nausea, vomiting, or diarrhea. °· You are having trouble gaining weight. °· You have feelings of depression or other emotions that are interfering with your normal activities. °Get help right away if: °· You have a fever. °· You have pain with urination. °· You have fluid leaking from your vagina. °· You have a bad-smelling vaginal discharge. °· You notice increased swelling in your face, hands, legs, or ankles. °· You have spotting or bleeding from your vagina. °· You have pelvic cramps, pelvic pressure, or nagging pain in your abdomen or lower back. °· You are having regular contractions. °· You develop a severe headache, with or without visual changes. °· You have shortness of breath or chest pain. °· You notice less fetal movement, or no fetal movement. °Summary °· Having a multiple pregnancy means that a woman is carrying more than one baby at a time. °· A multiple pregnancy puts you at a higher risk for certain problems during and after your pregnancy, such as: having your babies delivered before you have reached a full-term pregnancy (preterm birth), diabetes, preeclampsia, excessive blood loss after childbirth (postpartum hemorrhage), postpartum depression, or low birth weight of the babies. °· Your health care provider will want to monitor you more closely during your pregnancy to make sure that your babies are growing normally and that you are healthy. °· You may need to make some lifestyle changes during  pregnancy, including: increasing your nutrition, limiting your activities after 20-24 weeks of pregnancy, and arranging for extra help around the house. °This information is not intended to replace advice given to you by your health care provider. Make sure you discuss any questions you have with your health care provider. °Document Released: 01/22/2008 Document Revised: 08/05/2018 Document Reviewed: 12/14/2015 °Elsevier Patient Education © 2020 [employer] ° °

## 2019-02-08 ENCOUNTER — Other Ambulatory Visit: Payer: Self-pay

## 2019-02-08 ENCOUNTER — Ambulatory Visit: Payer: Self-pay | Admitting: Women's Health

## 2019-02-08 ENCOUNTER — Ambulatory Visit (INDEPENDENT_AMBULATORY_CARE_PROVIDER_SITE_OTHER): Payer: Self-pay | Admitting: *Deleted

## 2019-02-08 DIAGNOSIS — O30039 Twin pregnancy, monochorionic/diamniotic, unspecified trimester: Secondary | ICD-10-CM

## 2019-02-08 DIAGNOSIS — Z8759 Personal history of other complications of pregnancy, childbirth and the puerperium: Secondary | ICD-10-CM | POA: Insufficient documentation

## 2019-02-08 DIAGNOSIS — O0990 Supervision of high risk pregnancy, unspecified, unspecified trimester: Secondary | ICD-10-CM | POA: Insufficient documentation

## 2019-02-08 DIAGNOSIS — O3680X0 Pregnancy with inconclusive fetal viability, not applicable or unspecified: Secondary | ICD-10-CM

## 2019-02-08 NOTE — Progress Notes (Signed)
I connected with  Leah Gray on 02/08/19 at 10:30 AM EDT by telephone and verified that I am speaking with the correct person using two identifiers.   I discussed the limitations, risks, security and privacy concerns of performing an evaluation and management service by telephone and the availability of in person appointments. I also discussed with the patient that there may be a patient responsible charge related to this service. The patient expressed understanding and agreed to proceed. Explained I am completing her New OB Intake today. We discussed Her EDD and that it is based on  [place] by provider in MAU She also reports  LMP  was June 22 , not  July 25 as previously reported. I reviewed her allergies, meds, OB History, Medical /Surgical history, and appropriate screenings. I explained we will send her [employer] app- app sent to her while on phone. She will download after we finish call.  Explained we will send a blood pressure cuff to her once she has active Medicaid. Explained  then we will have her take her blood pressure weekly and enter into the app. Explained she will have some visits in office and some virtually. She already has Community education officer. Reviewed appointment date/ time with her , our location and to wear mask, no visitors. Explained she will have exam, ob bloodwork, hemoglobin a1C, cbg , genetic testing if desired, pap if needed. I  scheduled an [place] at 19 weeks and gave her the appointment. She voices understanding.  Tabita Corbo,RN 02/08/2019  10:32 AM

## 2019-02-09 ENCOUNTER — Other Ambulatory Visit (HOSPITAL_COMMUNITY)
Admission: RE | Admit: 2019-02-09 | Discharge: 2019-02-09 | Disposition: A | Discharge status: Home / Self Care | Payer: Medicaid Other | Source: Ambulatory Visit | Attending: Medical | Admitting: Medical

## 2019-02-09 ENCOUNTER — Ambulatory Visit (INDEPENDENT_AMBULATORY_CARE_PROVIDER_SITE_OTHER): Payer: Medicaid Other | Admitting: Medical

## 2019-02-09 ENCOUNTER — Encounter: Payer: Self-pay | Admitting: Medical

## 2019-02-09 VITALS — BP 111/63 | HR 77 | Temp 98.0°F | Wt 157.5 lb

## 2019-02-09 DIAGNOSIS — O0990 Supervision of high risk pregnancy, unspecified, unspecified trimester: Secondary | ICD-10-CM | POA: Diagnosis not present

## 2019-02-09 DIAGNOSIS — O0992 Supervision of high risk pregnancy, unspecified, second trimester: Secondary | ICD-10-CM

## 2019-02-09 DIAGNOSIS — O30032 Twin pregnancy, monochorionic/diamniotic, second trimester: Secondary | ICD-10-CM

## 2019-02-09 DIAGNOSIS — O09892 Supervision of other high risk pregnancies, second trimester: Secondary | ICD-10-CM | POA: Diagnosis not present

## 2019-02-09 DIAGNOSIS — Z3A16 16 weeks gestation of pregnancy: Secondary | ICD-10-CM

## 2019-02-09 DIAGNOSIS — O30039 Twin pregnancy, monochorionic/diamniotic, unspecified trimester: Secondary | ICD-10-CM

## 2019-02-09 MED ORDER — FOLIC ACID 1 MG PO TABS: 1.0000 mg | ORAL_TABLET | Freq: Every day | ORAL | 9 refills | Status: DC

## 2019-02-09 NOTE — Patient Instructions (Addendum)
Second Trimester of Pregnancy  The second trimester is from week 14 through week 27 (month 4 through 6). This is often the time in pregnancy that you feel your best. Often times, morning sickness has lessened or quit. You may have more energy, and you may get hungry more often. Your unborn baby is growing rapidly. At the end of the sixth month, he or she is about 9 inches long and weighs about 1 pounds. You will likely feel the baby move between 18 and 20 weeks of pregnancy. Follow these instructions at home: Medicines  Take over-the-counter and prescription medicines only as told by your doctor. Some medicines are safe and some medicines are not safe during pregnancy.  Take a prenatal vitamin that contains at least 600 micrograms (mcg) of folic acid.  If you have trouble pooping (constipation), take medicine that will make your stool soft (stool softener) if your doctor approves. Eating and drinking   Eat regular, healthy meals.  Avoid raw meat and uncooked cheese.  If you get low calcium from the food you eat, talk to your doctor about taking a daily calcium supplement.  Avoid foods that are high in fat and sugars, such as fried and sweet foods.  If you feel sick to your stomach (nauseous) or throw up (vomit): ? Eat 4 or 5 small meals a day instead of 3 large meals. ? Try eating a few soda crackers. ? Drink liquids between meals instead of during meals.  To prevent constipation: ? Eat foods that are high in fiber, like fresh fruits and vegetables, whole grains, and beans. ? Drink enough fluids to keep your pee (urine) clear or pale yellow. Activity  Exercise only as told by your doctor. Stop exercising if you start to have cramps.  Do not exercise if it is too hot, too humid, or if you are in a place of great height (high altitude).  Avoid heavy lifting.  Wear low-heeled shoes. Sit and stand up straight.  You can continue to have sex unless your doctor tells you not to.  Relieving pain and discomfort  Wear a good support bra if your breasts are tender.  Take warm water baths (sitz baths) to soothe pain or discomfort caused by hemorrhoids. Use hemorrhoid cream if your doctor approves.  Rest with your legs raised if you have leg cramps or low back pain.  If you develop puffy, bulging veins (varicose veins) in your legs: ? Wear support hose or compression stockings as told by your doctor. ? Raise (elevate) your feet for 15 minutes, 3-4 times a day. ? Limit salt in your food. Prenatal care  Write down your questions. Take them to your prenatal visits.  Keep all your prenatal visits as told by your doctor. This is important. Safety  Wear your seat belt when driving.  Make a list of emergency phone numbers, including numbers for family, friends, the hospital, and police and fire departments. General instructions  Ask your doctor about the right foods to eat or for help finding a counselor, if you need these services.  Ask your doctor about local prenatal classes. Begin classes before month 6 of your pregnancy.  Do not use hot tubs, steam rooms, or saunas.  Do not douche or use tampons or scented sanitary pads.  Do not cross your legs for long periods of time.  Visit your dentist if you have not done so. Use a soft toothbrush to brush your teeth. Floss gently.  Avoid all smoking, herbs,   and alcohol. Avoid drugs that are not approved by your doctor.  Do not use any products that contain nicotine or tobacco, such as cigarettes and e-cigarettes. If you need help quitting, ask your doctor.  Avoid cat litter boxes and soil used by cats. These carry germs that can cause birth defects in the baby and can cause a loss of your baby (miscarriage) or stillbirth. Contact a doctor if:  You have mild cramps or pressure in your lower belly.  You have pain when you pee (urinate).  You have bad smelling fluid coming from your vagina.  You continue to feel  sick to your stomach (nauseous), throw up (vomit), or have watery poop (diarrhea).  You have a nagging pain in your belly area.  You feel dizzy. Get help right away if:  You have a fever.  You are leaking fluid from your vagina.  You have spotting or bleeding from your vagina.  You have severe belly cramping or pain.  You lose or gain weight rapidly.  You have trouble catching your breath and have chest pain.  You notice sudden or extreme puffiness (swelling) of your face, hands, ankles, feet, or legs.  You have not felt the baby move in over an hour.  You have severe headaches that do not go away when you take medicine.  You have trouble seeing. Summary  The second trimester is from week 14 through week 27 (months 4 through 6). This is often the time in pregnancy that you feel your best.  To take care of yourself and your unborn baby, you will need to eat healthy meals, take medicines only if your doctor tells you to do so, and do activities that are safe for you and your baby.  Call your doctor if you get sick or if you notice anything unusual about your pregnancy. Also, call your doctor if you need help with the right food to eat, or if you want to know what activities are safe for you. This information is not intended to replace advice given to you by your health care provider. Make sure you discuss any questions you have with your health care provider. Document Released: 07/09/2009 Document Revised: 08/06/2018 Document Reviewed: 05/20/2016 Elsevier Patient Education  2020 Elsevier Inc.  AREA PEDIATRIC/FAMILY PRACTICE PHYSICIANS  [place] ([id number]) . [facility] Melodie Bouillono Chambliss, MD; Lum BabeEniola, MD; Sheffield SliderHale, MD; Leveda AnnaHensel, MD; McDiarmid, MD; Jerene BearsMcIntyer, MD; Jennette KettleNeal, MD; Gwendolyn GrantWalden, MD o [address] [place], [place] [address] o [phone] o Mon-Fri 8:30-12:30, 1:30-5:00 o Providers come to see babies at [facility] o Accepting Medicaid  . [facility] [place] o Limited providers who accept newborns: Docia ChuckKoirala, MD; Kateri PlummerMorrow, MD; Paulino RilyWolters, MD o [address], [place], [place] [address] o [phone] o Mon-Fri 8:00-5:30 o Babies seen by providers at [facility] o Does NOT accept Medicaid o Please call early in hospitalization for appointment (limited availability)  . [facility] Fatima Sangero Mulberry, MD o [address] [place], [place] [address] o [phone] o Mon, Tue, Thur, Fri 8:30-5:00, Wed 10:00-7:00 (closed 1-2pm) o Babies seen by [facility] providers o Accepting Medicaid . Donnie Coffinubin - Pediatrician Fae Pippino Rubin, MD o [address], [place], [place] [address] o [phone] o Mon-Fri 8:30-5:00, Sat 8:30-12:00 o Provider comes to see babies at [facility] o Accepting Medicaid o Must have been referred from current patients or contacted office prior to delivery . [facility]) Leotis Pain, MD; Ave Filter, MD; Luna Fuse, MD; Kennedy Bucker, MD; Konrad Dolores, MD; Kathlene November, MD; Jenne Campus, MD; Lubertha South, MD; Wynetta Emery, MD; Duffy Rhody, MD; Gerre Couch, NP; Shirl Harris, NP o [address] [place], [place], [place] [address] o [phone] o Mon, Tue, Thur, Fri 8:30-5:30, Wed 9:30-5:30, Sat 8:30-12:30 o Babies seen by [facility] providers o Accepting Medicaid o Only accepting infants of first-time parents or siblings of current patients [facility] discharge coordinator will make follow-up appointment . Cyril Mourning o [address] [place], [place]  [address] o [phone]   Fax - [phone] . [facility] o [address], [place], [place]  [address] o Phone - [phone]   Fax - [phone] . Lucio Edward o [address], [place], [place]  [address] o [phone]  [place] [phone]) . [facility] Jorge Mandril, MD; Alita Chyle, MD; Princella Ion, MD; MD; Earlene Plater,  MD; Jamesetta Orleans, MD; Alvera Novel, MD; Clarene Duke, MD; Rana Snare, MD; Carmon Ginsberg, MD; Alinda Money, MD; Hosie Poisson, MD; Mayford Knife, MD o [address] [place], [place] [address] o [phone] o Mon-Fri 8:30-5:00 (extended evenings Mon-Thur as needed), Sat-Sun 10:00-1:00 o Providers come to see babies at [facility] o Accepting Medicaid for families of first-time babies and families with all children in the household age 79 and under. Must register with office prior to making appointment (M-F only). [facility] Odella Aquas, NP; Lynelle Doctor, MD; Susann Givens, MD; [place], [place] o [address] [place], [place] [address] o [phone] o Mon-Fri 8:00-5:00 o Babies seen by providers at [facility] o Does NOT accept Medicaid/Commercial Insurance Only . [facility] [place] [facility]  Suzette Battiest, MD; Zachery Dauer, MD; Stefan Church, MD; Sabino Dick, MD; Quitman Livings, MD; Farris Has, MD; Gaynell Face, MD; Betha Loa, MD; Colon Flattery, MD; Clifton James, MD o [address] [place] [place], [place] [address] o [phone] o Mon-Fri 8:30-5:30, Sat (Oct.-Mar.) 9:00-1:00 o Babies seen by providers at [facility] o Accepting [facility] [phone]) . [facility] Dimitri, MD; Sheliah Hatch, MD o [address], [place], [place] [address] o [phone] o Mon-Fri 8:30-5:00, Sat 8:30-12:00 o Providers come to see babies at [facility] o Does NOT accept Medicaid . [facility] Cindy Hazy, [place]; [place], MD; [place], [place]; Wynelle Link, MD; Azucena Cecil, MD o [address] [place], [place] [address] o [phone] o Mon-Fri 8:00-5:00 o Babies seen by providers at [facility] o Does NOT accept Medicaid o Only accepting babies of parents who are patients o Please call early in hospitalization for appointment (limited availability) . [facility]ians Lamar Benes, MD; Abran Cantor, MD; Early Osmond, MD; Cherre Huger, NP; Hyacinth Meeker, MD; Dwan Bolt, MD; Jarold Motto, NP; Dario Guardian, MD; Talmage Nap,  MD; Maisie Fus, MD; Pricilla Holm, MD; Tama High, MD o [address] [place], [place], [place] [address] o [phone] o Mon-Fri 8:00-5:00, Sat 9:00-12:00 o Providers come to see babies at [facility] o Does NOT accept [facility] [phone]) . [facility] o Limited providers accepting new patients: Drema Pry, NP; Roberts, PA o [address] [place], [place] [address] o [phone] o Mon-Fri 8:00-5:00 o Babies seen by providers at [facility] o Does NOT accept Medicaid o Only accepting babies of parents who are patients o Please call early in hospitalization for appointment (limited availability) . [facility] Luan Pulling, MD; Nash Dimmer, MD o [address] [place], [place] [address] o [phone] (press 1 to schedule appointment) o Mon-Fri 8:00-5:00 o  Providers come to see babies at [facility] o Does NOT accept Medicaid . [facility] Cristino Martes, MD o [address], [place], [place] [address] o [phone] o Mon-Fri 8:30-5:00 (lunch 12:30-1:00), extended hours by appointment only Wed 5:00-6:30 o Babies seen by [facility] providers o Accepting Medicaid . [facility], MD; [place], MD; Hassan Rowan, MD o [address] [place], [place], [place] [address] o [phone] o Mon-Fri 8:00-5:00 o Babies seen by [facility] providers o Does NOT accept Medicaid . Nature conservation officer at [place] [address] Elsworth Soho, MD; Durene Cal, MD; [place], DO o [address] [place], [place] [address] o [phone] o Mon-Fri 8:00-5:00 o Babies seen by [facility] providers o Does NOT accept Medicaid . [facility] o [place], [place]; [place], [place]; [place], NP; Avis Epley, MD; Vonna Kotyk, MD; Clance Boll, MD; Stevphen Rochester, NP; Arvilla Market, NP; Ann Maki, NP; Otis Dials, NP; Vaughan Basta, MD; Dray[place], MD o [address] [place], [place] [address] o [phone] o Mon-Fri 8:30-5:00, Sat 10:00-1:00 o Providers come to see  babies at [facility] o Does NOT accept Medicaid o Free prenatal information session Tuesdays at 4:45pm . [facility] Luna Kitchens, MD; [place], [place]; [place], [place]; [place], [place] o [address] [place] [address] o [phone] o Mon-Fri 7:30-5:30 o Babies seen by [facility] providers . [facility] o [address], [place], [place]  [address] o [phone]   Fax - [phone]  [place] [phone] & [phone]) . [facility] Alphonsa Overall, MD o [address] [place], [place] [address] o [phone] o Mon-Thur 8:00-6:00 o Providers come to see babies at [facility] o Accepting Medicaid . [facility] Zenon Mayo, NP; Cyndia Bent, MD; [place], [place]; [place], [place] o [address] [place], [place] [address] o [phone] o Mon-Thur 7:30-7:30, Fri 7:30-4:30 o Babies seen by [facility] providers o Accepting Medicaid . [facility] Cheryle Horsfall, MD; Janene Harvey, NP; Vonita Moss, MD o [address], [place], [place] [address] o [phone] o Mon-Fri 8:30-5:00, Sat 8:30-12:00 o Providers come to see babies at [facility] o Accepting Medicaid o Must have "Meet & Greet" appointment at office prior to delivery . [facility] Llana Aliment, MD; Earlene Plater, MD; Lucretia Roers, MD o [address], [place], [place] [address] o [phone] o Mon-Wed 8:00-6:00, Thur-Fri 8:00-5:00, Sat 9:00-12:00 o Providers come to see babies at [facility] o Does NOT accept Medicaid o Only accepting siblings of current patients . [facility]  o [address], [place], [place]  [address] o [phone]   Fax - [phone] . [facility] at [facility] o [phone] [address], [place], [place]  [address] o [phone]   Fax - [phone]  [place]  [phone] & [phone])  . Nature conservation officer at [facility] o [place], DO; [place], DO o [address] [place], [place] [address] o [phone] o Mon-Fri 7:00-5:00 o Babies seen by [facility] providers o Does NOT accept Medicaid . [facility] Ellis Savage, MD; [place], [place]; [place], [place] o [address], [place], [place] [address] o [phone] o Mon-Fri 8:00-5:00 o Babies seen by [facility] providers o Accepting Medicaid . [facility] - [address] Franne Forts, MD; [place], [place]; [place], NP; [place], [place] o [address] [place], [place], [place] [address] o [phone] o Mon-Fri 8:00-5:00 o Babies seen by providers at  [facility] o Accepting Medicaid  [place] ([address]) . [facility] [place] o [place], DO o [address] [place], [place] [address] o [phone] o Mon-Fri 8:00-5:00 o Babies seen by [facility] providers o Does NOT accept Medicaid o Limited availability, please call early in hospitalization to schedule follow-up . [facility] o Calderon, PA; Cummings, MD; Dillard, MD; Martin, PA; Olson, MD; VanDeven, PA o [address], [place], [place] [address] o [phone] o Mon-Fri 8:30-5:00, Sat 9:00-12:00 o Babies seen by providers at [facility] o Accepting Medicaid o Please register online then schedule online or call office o [email or web address] . [facility] o Hunter, NP; Kumar, MD; Martin Rogers, PA o [address], [place], [place] [address] o [phone] o Mon-Fri 8:00-5:00 o Babies seen by providers at [facility] o Accepting Medicaid . [facility] o Talent, MD; Kristi Fleenor, NP; [place], MD o [address], [place], [place] [address] o [phone] o Mon-Fri  8:00-5:30, Sat&Sun by appointment (phones open at 8:30) o Babies seen by [facility] providers o Accepting Medicaid o Must be a first-time baby or sibling of current patient . [facility] [place]  o [address], [place], [place]  [address] o [phone]   Fax - [phone]  [place] ([phone]) . [place] [facility] o Brown, PA; Cowen, PA; Rice, MD; Helton, PA; Spry, MD o [address] [place], [place] [address] o [phone] o Mon-Thur 8:00-7:00, Fri 8:00-5:00, Sat 8:00-12:00, Sun 9:00-12:00 o Babies seen by [facility] providers o Accepting Medicaid . [facility] [place] o Coe-Goins, MD; Marshall, MD; Pierre-Louis, MD o [address] [place] [address], [place], [place] [address] o [phone] o Mon-Thur 8:00-5:00 o Babies seen by providers at [facility] o Accepting Medicaid . [facility] [place] o Bratton, MD; Coe-Goins, MD; Hayes, MD; Lewis, MD; List, MD; Lott, MD; Marshall, MD; Moran, MD; O'Neal, MD; Pierre-Louis, MD; Pitonzo, MD; Scholer, MD; Spangle, MD o [address] [place], [place] [address] o [phone] o Mon-Fri 8:00-5:30, Sat (Oct.-Mar.) 9:00-1:00 o Babies seen by providers at [facility] o Accepting Medicaid o Must fill out new patient packet, available online at [email or web address] . [facility] ([facility] o Friddle, NP; Harris, NP; Kelly, NP; Logan, MD; Melvin, PA; Poth, MD; Ramadoss, MD; Stanton, NP o [address], [place], [place] [address] o [phone] o Mon-Thur 8:00-5:30, Fri 8:00-5:00 o Babies seen by providers at [facility] o Accepting Medicaid  [place] ([address]) . [facility] [place] [facility] o Dixon, PA; [place], MD; Pickard, MD; Tapia, PA o [address], [place], [place] [address] o [phone] o Mon-Fri  8:00-5:00 o Babies seen by providers at [facility] o Accepting Medicaid   [place] ([address]) . [facility] [place] o Masneri, DO; Meyers, MD; Nelson, PA o [address], [place], [place] [address] o [phone] o Mon-Fri 8:00-5:00 o Babies seen by providers at [facility] o Does NOT accept Medicaid o Limited appointment availability, please call early in hospitalization  . [facility] [place] o Kunedd, DO; McGowen, MD o [address], [place],  [address]   o [phone] o Mon-Fri 8:00-5:00 o Babies seen by [facility]  providers o Does NOT accept Medicaid . [facility] Su Grand, MD; Guy Sandifer, MD; [place], [place]; [place], [place] [address], [place], [place] [address] o [phone] o Mon-Fri 8:00-5:00 o After hours clinic [facility] [place], [place] [address]) [phone] Mon-Fri 5:00-8:00, Sat 12:00-6:00, Sun 10:00-4:00 o Babies seen by [facility] providers o Accepting Medicaid . [place] [facility] o [address] [place], [place]  [address] o [phone]   Fax - [phone]  [place] [phone]) . Therapist, music at [facility], MD o [address] [place], [place], [place] [address] o [phone] o Mon-Fri 8:00-5:00 o Babies seen by [facility] providers o Does NOT accept Medicaid . [place] [facility] [facility] [place]) Bing Neighbors, MD o [address] [place], [place], [place] [address] o [phone] o Mon-Thur 8:00-7:00, Fri 8:00-5:00, Sat 8:00-12:00 o Babies seen by providers at [facility] o Accepting Medicaid - but does not have vaccinations in office (must be received elsewhere) o Limited availability, please call early in hospitalization  [place] ([address]) . [place], [place] [address] [place] [address] o [phone]  Fax [phone]

## 2019-02-09 NOTE — Progress Notes (Signed)
   PRENATAL VISIT NOTE  Subjective:  Anastazja Isaac is a 23 y.o. G3P0020 at [redacted]w[redacted]d being seen today for her first prenatal visit for this pregnancy.  She is currently monitored for the following issues for this high-risk pregnancy and has Supervision of high risk pregnancy, antepartum and Monochorionic diamniotic twin pregnancy, antepartum on their problem list.  Patient reports no complaints.  Contractions: Not present. Vag. Bleeding: None.  Movement: Present. Denies leaking of fluid.   She is planning to breastfeed. Desires unsure for contraception.   The following portions of the patient's history were reviewed and updated as appropriate: allergies, current medications, past family history, past medical history, past social history, past surgical history and problem list.   Objective:   Vitals:   02/09/19 0920  BP: 111/63  Pulse: 77  Temp: 98 F (36.7 C)  Weight: 157 lb 8 oz (71.4 kg)    Fetal Status: Fetal Heart Rate (bpm): 151   Movement: Present    POCUS used to visualize +FM x 2 and +FHR x 2 that appear normal.   General:  Alert, oriented and cooperative. Patient is in no acute distress.  Skin: Skin is warm and dry. No rash noted.   Cardiovascular: Normal heart rate and rhythm noted  Respiratory: Normal respiratory effort, no problems with respiration noted. Clear to auscultation.   Abdomen: Soft, gravid, appropriate for gestational age. Normal bowel sounds. Non-tender. Pain/Pressure: Absent     Pelvic: Cervical exam deferred         Extremities: Normal range of motion.  Edema: None  Mental Status: Normal mood and affect. Normal behavior. Normal judgment and thought content.   Assessment and Plan:  Pregnancy: P5T6144 at [redacted]w[redacted]d 1. Supervision of high risk pregnancy, antepartum - Culture, OB Urine - Genetic Screening - Obstetric Panel, Including HIV - Hemoglobin A1c - Cervicovaginal ancillary only( )  2. Monochorionic diamniotic twin pregnancy, antepartum -  folic acid (FOLVITE) 1 MG tablet; Take 1 tablet (1 mg total) by mouth daily.  Dispense: 30 tablet; Refill: 9  Anatomy US already scheduled  Discussed the nature of our practice with multiple providers  Discussed the cadence of OB visits for a high risk pregnancy. Also discussed the plan for antenatal testing and routine growth [place] throughout third trimester.  Discussed the plan for delivery at no later than 37 weeks with this pregnancy  Preterm labor/second symptoms and general obstetric precautions including but not limited to vaginal bleeding, contractions, leaking of fluid and fetal movement were reviewed in detail with the patient. Please refer to After Visit Summary for other counseling recommendations.   Return in about 4 weeks (around 03/09/2019) for [place], In-Person.  Future Appointments  Date Time Provider [place]  02/28/2019  8:00 AM WH-MFC [place] 3 WH-MFCUS MFC-US    Kerry Hough, PA-C

## 2019-02-10 ENCOUNTER — Encounter: Payer: Self-pay | Admitting: Medical

## 2019-02-10 DIAGNOSIS — Z283 Underimmunization status: Secondary | ICD-10-CM | POA: Insufficient documentation

## 2019-02-10 DIAGNOSIS — O09899 Supervision of other high risk pregnancies, unspecified trimester: Secondary | ICD-10-CM | POA: Insufficient documentation

## 2019-02-10 LAB — OBSTETRIC PANEL, INCLUDING HIV
Antibody Screen: NEGATIVE
Basophils Absolute: 0 10*3/uL (ref 0.0–0.2)
Basos: 0 %
EOS (ABSOLUTE): 0.2 10*3/uL (ref 0.0–0.4)
Eos: 2 %
HIV Screen 4th Generation wRfx: NONREACTIVE
Hematocrit: 34.8 % (ref 34.0–46.6)
Hemoglobin: 11.7 g/dL (ref 11.1–15.9)
Hepatitis B Surface Ag: NEGATIVE
Immature Grans (Abs): 0 10*3/uL (ref 0.0–0.1)
Immature Granulocytes: 0 %
Lymphocytes Absolute: 1.7 10*3/uL (ref 0.7–3.1)
Lymphs: 19 %
MCH: 32.1 pg (ref 26.6–33.0)
MCHC: 33.6 g/dL (ref 31.5–35.7)
MCV: 95 fL (ref 79–97)
Monocytes Absolute: 0.5 10*3/uL (ref 0.1–0.9)
Monocytes: 6 %
Neutrophils Absolute: 6.5 10*3/uL (ref 1.4–7.0)
Neutrophils: 73 %
Platelets: 296 10*3/uL (ref 150–450)
RBC: 3.65 x10E6/uL — ABNORMAL LOW (ref 3.77–5.28)
RDW: 12 % (ref 11.7–15.4)
RPR Ser Ql: NONREACTIVE
Rh Factor: POSITIVE
Rubella Antibodies, IGG: <0.9 index — ABNORMAL LOW (ref >0.99)
WBC: 9 10*3/uL (ref 3.4–10.8)

## 2019-02-10 LAB — HEMOGLOBIN A1C
Est. average glucose Bld gHb Est-mCnc: 103 mg/dL
Hgb A1c MFr Bld: 5.2 % (ref 4.8–5.6)

## 2019-02-12 LAB — URINE CULTURE, OB REFLEX

## 2019-02-12 LAB — CULTURE, OB URINE

## 2019-02-14 LAB — CERVICOVAGINAL ANCILLARY ONLY
Chlamydia: NEGATIVE
Comment: NEGATIVE
Comment: NORMAL
Neisseria Gonorrhea: NEGATIVE

## 2019-02-22 ENCOUNTER — Encounter: Payer: Self-pay | Admitting: *Deleted

## 2019-02-28 ENCOUNTER — Encounter (HOSPITAL_COMMUNITY): Payer: Self-pay

## 2019-02-28 ENCOUNTER — Other Ambulatory Visit: Payer: Self-pay

## 2019-02-28 ENCOUNTER — Ambulatory Visit (HOSPITAL_COMMUNITY)
Admission: RE | Admit: 2019-02-28 | Discharge: 2019-02-28 | Disposition: A | Discharge status: Home / Self Care | Payer: Medicaid Other | Source: Ambulatory Visit | Attending: Obstetrics and Gynecology | Admitting: Obstetrics and Gynecology

## 2019-02-28 ENCOUNTER — Other Ambulatory Visit (HOSPITAL_COMMUNITY): Payer: Self-pay | Admitting: *Deleted

## 2019-02-28 ENCOUNTER — Ambulatory Visit (HOSPITAL_COMMUNITY): Payer: Medicaid Other | Admitting: *Deleted

## 2019-02-28 DIAGNOSIS — O30039 Twin pregnancy, monochorionic/diamniotic, unspecified trimester: Secondary | ICD-10-CM

## 2019-02-28 DIAGNOSIS — Z3A19 19 weeks gestation of pregnancy: Secondary | ICD-10-CM

## 2019-02-28 DIAGNOSIS — O30032 Twin pregnancy, monochorionic/diamniotic, second trimester: Secondary | ICD-10-CM | POA: Diagnosis not present

## 2019-02-28 DIAGNOSIS — O0990 Supervision of high risk pregnancy, unspecified, unspecified trimester: Secondary | ICD-10-CM | POA: Diagnosis not present

## 2019-03-01 ENCOUNTER — Encounter: Payer: Self-pay | Admitting: *Deleted

## 2019-03-03 ENCOUNTER — Encounter: Payer: Self-pay | Admitting: *Deleted

## 2019-03-10 ENCOUNTER — Ambulatory Visit (INDEPENDENT_AMBULATORY_CARE_PROVIDER_SITE_OTHER): Payer: Medicaid Other | Admitting: Obstetrics & Gynecology

## 2019-03-10 ENCOUNTER — Other Ambulatory Visit: Payer: Self-pay

## 2019-03-10 VITALS — BP 117/64 | HR 82 | Temp 98.3°F | Wt 163.0 lb

## 2019-03-10 DIAGNOSIS — O30032 Twin pregnancy, monochorionic/diamniotic, second trimester: Secondary | ICD-10-CM

## 2019-03-10 DIAGNOSIS — O0992 Supervision of high risk pregnancy, unspecified, second trimester: Secondary | ICD-10-CM

## 2019-03-10 DIAGNOSIS — Z1389 Encounter for screening for other disorder: Secondary | ICD-10-CM

## 2019-03-10 DIAGNOSIS — O30039 Twin pregnancy, monochorionic/diamniotic, unspecified trimester: Secondary | ICD-10-CM

## 2019-03-10 DIAGNOSIS — Z3A20 20 weeks gestation of pregnancy: Secondary | ICD-10-CM

## 2019-03-10 DIAGNOSIS — O0990 Supervision of high risk pregnancy, unspecified, unspecified trimester: Secondary | ICD-10-CM

## 2019-03-10 MED ORDER — BLOOD PRESSURE MONITORING DEVI: 1.0000 | 0 refills | Status: DC

## 2019-03-10 NOTE — Progress Notes (Signed)
Medicaid Home Form completed-03/10/2019

## 2019-03-10 NOTE — Progress Notes (Signed)
   PRENATAL VISIT NOTE  Subjective:  Leah Gray is a 22 y.o. G3P0020 at [redacted]w[redacted]d being seen today for ongoing prenatal care.  She is currently monitored for the following issues for this high-risk pregnancy and has Supervision of high risk pregnancy, antepartum; Monochorionic diamniotic twin pregnancy, antepartum; and Rubella non-immune status, antepartum on their problem list.  Patient reports no complaints.  Contractions: Not present. Vag. Bleeding: None.  Movement: Present. Denies leaking of fluid.   The following portions of the patient's history were reviewed and updated as appropriate: allergies, current medications, past family history, past medical history, past social history, past surgical history and problem list.   Objective:   Vitals:   03/10/19 1105  BP: 117/64  Pulse: 82  Temp: 98.3 F (36.8 C)  Weight: 163 lb (73.9 kg)    Fetal Status:   Fundal Height: 24 cm Movement: Present     General:  Alert, oriented and cooperative. Patient is in no acute distress.  Skin: Skin is warm and dry. No rash noted.   Cardiovascular: Normal heart rate noted  Respiratory: Normal respiratory effort, no problems with respiration noted  Abdomen: Soft, gravid, appropriate for gestational age.  Pain/Pressure: Present     Pelvic: Cervical exam deferred        Extremities: Normal range of motion.  Edema: None  Mental Status: Normal mood and affect. Normal behavior. Normal judgment and thought content.   Assessment and Plan:  Pregnancy: [id number] at [redacted]w[redacted]d 1. Monochorionic diamniotic twin pregnancy, antepartum [place] f/u 11/16  2. Supervision of high risk pregnancy, antepartum   Preterm labor symptoms and general obstetric precautions including but not limited to vaginal bleeding, contractions, leaking of fluid and fetal movement were reviewed in detail with the patient. Please refer to After Visit Summary for other counseling recommendations.   Return in about 4 weeks (around 04/07/2019).  Future Appointments  Date Time Provider [place]  03/14/2019  1:00 PM [place] MFC-US  03/14/2019  1:00 PM [place] 3 WH-MFCUS MFC-US  03/28/2019  8:00 AM [place] NURSE WH-MFC MFC-US  03/28/2019  8:00 AM WH-MFC [place] 3 WH-MFCUS MFC-US  04/11/2019 10:00 AM WH-MFC NURSE WH-MFC MFC-US  04/11/2019 10:00 AM WH-MFC [place] 3 WH-MFCUS MFC-US    Emeterio Reeve, MD

## 2019-03-10 NOTE — Patient Instructions (Signed)
Multiple Pregnancy Having a multiple pregnancy means that a woman is carrying more than one baby at a time. She may be pregnant with twins, triplets, or more. The majority of multiple pregnancies are twins. Naturally conceiving triplets or more (higher-order multiples) is rare. Multiple pregnancies are riskier than single pregnancies. A woman with a multiple pregnancy is more likely to have certain problems during her pregnancy. Therefore, she will need to have more frequent appointments for prenatal care. How does a multiple pregnancy happen? A multiple pregnancy happens when:  The woman's body releases more than one egg at a time, and then each egg gets fertilized by a different sperm. ? This is the most common type of multiple pregnancy. ? Twins or other multiples produced this way are fraternal. They are no more alike than non-multiple siblings are.  One sperm fertilizes one egg, which then divides into more than one embryo. ? Twins or other multiples produced this way are identical. Identical multiples are always the same gender, and they look very much alike. Who is most likely to have a multiple pregnancy? A multiple pregnancy is more likely to develop in women who:  Have had fertility treatment, especially if the treatment included fertility drugs.  Are older than 25 years of age.  Have already had four or more children.  Have a family history of multiple pregnancy. How is a multiple pregnancy diagnosed? A multiple pregnancy may be diagnosed based on:  Symptoms such as: ? Rapid weight gain in the first 3 months of pregnancy (first trimester). ? More severe nausea and breast tenderness than what is typical of a single pregnancy. ? The uterus measuring larger than what is normal for the stage of the pregnancy.  Blood tests that detect a higher-than-normal level of human chorionic gonadotropin (hCG). This is a hormone that your body produces in early pregnancy.  Ultrasound exam.  This is used to confirm that you are carrying multiples. What risks are associated with multiple pregnancy? A multiple pregnancy puts you at a higher risk for certain problems during or after your pregnancy, including:  Having your babies delivered before you have reached a full-term pregnancy (preterm birth). A full-term pregnancy lasts for at least 37 weeks. Babies born before 75 weeks may have a higher risk of a variety of health problems, such as breathing problems, feeding difficulties, cerebral palsy, and learning disabilities.  Diabetes.  Preeclampsia. This is a serious condition that causes high blood pressure along with other symptoms, such as swelling and headaches, during pregnancy.  Excessive blood loss after childbirth (postpartum hemorrhage).  Postpartum depression.  Low birth weight of the babies. How will having a multiple pregnancy affect my care? Your health care provider will want to monitor you more closely during your pregnancy to make sure that your babies are growing normally and that you are healthy. Follow these instructions at home: Because your pregnancy is considered to be high risk, you will need to work closely with your health care team. You may also need to make some lifestyle changes. These may include the following: Eating and drinking  Increase your nutrition. ? Follow your health care provider's recommendations for weight gain. You may need to gain a little extra weight when you are pregnant with multiples. ? Eat healthy snacks often throughout the day. This can add calories and reduce nausea.  Drink enough fluid to keep your urine pale yellow.  Take prenatal vitamins. Activity By 20-24 weeks, you may need to limit your activities.  Avoid activities and work that take a lot of effort (are strenuous). °· Ask your health care provider when you should stop having sexual intercourse. °· Rest often. °General instructions °· Do not use any products that  contain nicotine or tobacco, such as cigarettes and e-cigarettes. If you need help quitting, ask your health care provider. °· Do not drink alcohol or use illegal drugs. °· Take over-the-counter and prescription medicines only as told by your health care provider. °· Arrange for extra help around the house. °· Keep all follow-up visits and all prenatal visits as told by your health care provider. This is important. °Contact a health care provider if: °· You have dizziness. °· You have persistent nausea, vomiting, or diarrhea. °· You are having trouble gaining weight. °· You have feelings of depression or other emotions that are interfering with your normal activities. °Get help right away if: °· You have a fever. °· You have pain with urination. °· You have fluid leaking from your vagina. °· You have a bad-smelling vaginal discharge. °· You notice increased swelling in your face, hands, legs, or ankles. °· You have spotting or bleeding from your vagina. °· You have pelvic cramps, pelvic pressure, or nagging pain in your abdomen or lower back. °· You are having regular contractions. °· You develop a severe headache, with or without visual changes. °· You have shortness of breath or chest pain. °· You notice less fetal movement, or no fetal movement. °Summary °· Having a multiple pregnancy means that a woman is carrying more than one baby at a time. °· A multiple pregnancy puts you at a higher risk for certain problems during and after your pregnancy, such as: having your babies delivered before you have reached a full-term pregnancy (preterm birth), diabetes, preeclampsia, excessive blood loss after childbirth (postpartum hemorrhage), postpartum depression, or low birth weight of the babies. °· Your health care provider will want to monitor you more closely during your pregnancy to make sure that your babies are growing normally and that you are healthy. °· You may need to make some lifestyle changes during  pregnancy, including: increasing your nutrition, limiting your activities after 20-24 weeks of pregnancy, and arranging for extra help around the house. °This information is not intended to replace advice given to you by your health care provider. Make sure you discuss any questions you have with your health care provider. °Document Released: 01/22/2008 Document Revised: 08/05/2018 Document Reviewed: 12/14/2015 °Elsevier Patient Education © 2020 [employer] ° °

## 2019-03-14 ENCOUNTER — Encounter (HOSPITAL_COMMUNITY): Payer: Self-pay | Admitting: *Deleted

## 2019-03-14 ENCOUNTER — Encounter: Payer: Self-pay | Admitting: *Deleted

## 2019-03-14 ENCOUNTER — Ambulatory Visit (HOSPITAL_COMMUNITY): Payer: Medicaid Other | Admitting: *Deleted

## 2019-03-14 ENCOUNTER — Ambulatory Visit (HOSPITAL_COMMUNITY)
Admission: RE | Admit: 2019-03-14 | Discharge: 2019-03-14 | Disposition: A | Discharge status: Home / Self Care | Payer: Medicaid Other | Source: Ambulatory Visit | Attending: Obstetrics and Gynecology | Admitting: Obstetrics and Gynecology

## 2019-03-14 ENCOUNTER — Other Ambulatory Visit: Payer: Self-pay

## 2019-03-14 DIAGNOSIS — O30032 Twin pregnancy, monochorionic/diamniotic, second trimester: Secondary | ICD-10-CM

## 2019-03-14 DIAGNOSIS — O30039 Twin pregnancy, monochorionic/diamniotic, unspecified trimester: Secondary | ICD-10-CM | POA: Diagnosis not present

## 2019-03-14 DIAGNOSIS — O0990 Supervision of high risk pregnancy, unspecified, unspecified trimester: Secondary | ICD-10-CM | POA: Insufficient documentation

## 2019-03-14 DIAGNOSIS — Z3A21 21 weeks gestation of pregnancy: Secondary | ICD-10-CM

## 2019-03-28 ENCOUNTER — Other Ambulatory Visit: Payer: Self-pay

## 2019-03-28 ENCOUNTER — Ambulatory Visit (HOSPITAL_COMMUNITY)
Admission: RE | Admit: 2019-03-28 | Discharge: 2019-03-28 | Disposition: A | Discharge status: Home / Self Care | Payer: Medicaid Other | Source: Ambulatory Visit | Attending: Obstetrics and Gynecology | Admitting: Obstetrics and Gynecology

## 2019-03-28 ENCOUNTER — Other Ambulatory Visit (HOSPITAL_COMMUNITY): Payer: Self-pay | Admitting: *Deleted

## 2019-03-28 ENCOUNTER — Encounter (HOSPITAL_COMMUNITY): Payer: Self-pay

## 2019-03-28 ENCOUNTER — Ambulatory Visit (HOSPITAL_COMMUNITY): Payer: Medicaid Other | Admitting: *Deleted

## 2019-03-28 DIAGNOSIS — O30032 Twin pregnancy, monochorionic/diamniotic, second trimester: Secondary | ICD-10-CM

## 2019-03-28 DIAGNOSIS — O359XX0 Maternal care for (suspected) fetal abnormality and damage, unspecified, not applicable or unspecified: Secondary | ICD-10-CM

## 2019-03-28 DIAGNOSIS — O30039 Twin pregnancy, monochorionic/diamniotic, unspecified trimester: Secondary | ICD-10-CM | POA: Insufficient documentation

## 2019-03-28 DIAGNOSIS — O0990 Supervision of high risk pregnancy, unspecified, unspecified trimester: Secondary | ICD-10-CM | POA: Insufficient documentation

## 2019-03-28 DIAGNOSIS — Z3A23 23 weeks gestation of pregnancy: Secondary | ICD-10-CM | POA: Diagnosis not present

## 2019-04-07 ENCOUNTER — Encounter: Payer: Self-pay | Admitting: Family Medicine

## 2019-04-07 ENCOUNTER — Other Ambulatory Visit: Payer: Self-pay

## 2019-04-07 ENCOUNTER — Ambulatory Visit (INDEPENDENT_AMBULATORY_CARE_PROVIDER_SITE_OTHER): Payer: Medicaid Other | Admitting: Obstetrics and Gynecology

## 2019-04-07 ENCOUNTER — Encounter: Payer: Self-pay | Admitting: Obstetrics and Gynecology

## 2019-04-07 VITALS — BP 125/67 | HR 85 | Wt 170.0 lb

## 2019-04-07 DIAGNOSIS — Z283 Underimmunization status: Secondary | ICD-10-CM

## 2019-04-07 DIAGNOSIS — O0992 Supervision of high risk pregnancy, unspecified, second trimester: Secondary | ICD-10-CM

## 2019-04-07 DIAGNOSIS — O30032 Twin pregnancy, monochorionic/diamniotic, second trimester: Secondary | ICD-10-CM

## 2019-04-07 DIAGNOSIS — O30039 Twin pregnancy, monochorionic/diamniotic, unspecified trimester: Secondary | ICD-10-CM

## 2019-04-07 DIAGNOSIS — Z3A24 24 weeks gestation of pregnancy: Secondary | ICD-10-CM

## 2019-04-07 DIAGNOSIS — O09899 Supervision of other high risk pregnancies, unspecified trimester: Secondary | ICD-10-CM

## 2019-04-07 DIAGNOSIS — O99891 Other specified diseases and conditions complicating pregnancy: Secondary | ICD-10-CM

## 2019-04-07 DIAGNOSIS — O0990 Supervision of high risk pregnancy, unspecified, unspecified trimester: Secondary | ICD-10-CM

## 2019-04-07 NOTE — Patient Instructions (Signed)
Use the following websites (and others) to help learn more about your contraception options and find the method that is right for you!  - The Centers for Disease Control (CDC) website: https://www.cdc.gov/reproductivehealth/contraception/index.htm  - [facility] website: https://www.plannedparenthood.org/learn/birth-control  - Bedsider.org: https://www.bedsider.org/methods   Go to bedsider.org for more information!  Contraception Choices Contraception, also called birth control, refers to methods or devices that prevent pregnancy. Hormonal methods Contraceptive implant A contraceptive implant is a thin, plastic tube that contains a hormone. It is inserted into the upper part of the arm. It can remain in place for up to 3 years. Progestin-only injections Progestin-only injections are injections of progestin, a synthetic form of the hormone progesterone. They are given every 3 months by a health care provider. Birth control pills Birth control pills are pills that contain hormones that prevent pregnancy. They must be taken once a day, preferably at the same time each day. Birth control patch The birth control patch contains hormones that prevent pregnancy. It is placed on the skin and must be changed once a week for three weeks and removed on the fourth week. A prescription is needed to use this method of contraception. Vaginal ring A vaginal ring contains hormones that prevent pregnancy. It is placed in the vagina for three weeks and removed on the fourth week. After that, the process is repeated with a new ring. A prescription is needed to use this method of contraception. Emergency contraceptive Emergency contraceptives prevent pregnancy after unprotected sex. They come in pill form and can be taken up to 5 days after sex. They work best the sooner they are taken after having sex. Most emergency contraceptives are available without a prescription. This method should not be used as  your only form of birth control. Barrier methods Female condom A female condom is a thin sheath that is worn over the penis during sex. Condoms keep sperm from going inside a woman's body. They can be used with a spermicide to increase their effectiveness. They should be disposed after a single use. Female condom A female condom is a soft, loose-fitting sheath that is put into the vagina before sex. The condom keeps sperm from going inside a woman's body. They should be disposed after a single use.  Intrauterine contraception Intrauterine device (IUD) An IUD is a T-shaped device that is put in a woman's uterus. There are two types:  Hormone IUD.This type contains progestin, a synthetic form of the hormone progesterone. This type can stay in place for 3-5 years.  Copper IUD.This type is wrapped in copper wire. It can stay in place for 10 years.  Permanent methods of contraception Female tubal ligation In this method, a woman's fallopian tubes are sealed, tied, or blocked during surgery to prevent eggs from traveling to the uterus.  Female sterilization This is a procedure to tie off the tubes that carry sperm (vasectomy). After the procedure, the man can still ejaculate fluid (semen).  Summary  Contraception, also called birth control, means methods or devices that prevent pregnancy.  Hormonal methods of contraception include implants, injections, pills, patches, vaginal rings, and emergency contraceptives.  Barrier methods of contraception can include female condoms, female condoms, diaphragms, cervical caps, sponges, and spermicides.  There are two types of IUDs (intrauterine devices). An IUD can be put in a woman's uterus to prevent pregnancy for 3-5 years.  Permanent sterilization can be done through a procedure for males, females, or both. This information is not intended to replace advice given to you   by your health care provider. Make sure you discuss any questions you have with your  health care provider. Document Released: 04/14/2005 Document Revised: 05/17/2016 Document Reviewed: 05/17/2016 Elsevier Interactive Patient Education  2018 Elsevier Inc.  

## 2019-04-07 NOTE — Progress Notes (Signed)
PRENATAL VISIT NOTE  Subjective:  Leah Gray is a 23 y.o. G3P0020 at [redacted]w[redacted]d being seen today for ongoing prenatal care.  She is currently monitored for the following issues for this high-risk pregnancy and has Supervision of high risk pregnancy, antepartum; Monochorionic diamniotic twin pregnancy, antepartum; and Rubella non-immune status, antepartum on their problem list.  Patient reports no complaints.  Contractions: Not present. Vag. Bleeding: None.  Movement: Present. Denies leaking of fluid.   The following portions of the patient's history were reviewed and updated as appropriate: allergies, current medications, past family history, past medical history, past social history, past surgical history and problem list.   Objective:   Vitals:   04/07/19 0936  BP: 125/67  Pulse: 85  Weight: 170 lb (77.1 kg)    Fetal Status: Fetal Heart Rate (bpm): 153/138   Movement: Present     General:  Alert, oriented and cooperative. Patient is in no acute distress.  Skin: Skin is warm and dry. No rash noted.   Cardiovascular: Normal heart rate noted  Respiratory: Normal respiratory effort, no problems with respiration noted  Abdomen: Soft, gravid, appropriate for gestational age.  Pain/Pressure: Absent     Pelvic: Cervical exam deferred        Extremities: Normal range of motion.  Edema: None  Mental Status: Normal mood and affect. Normal behavior. Normal judgment and thought content.   Assessment and Plan:  Pregnancy: G3P0020 at [redacted]w[redacted]d  1. Supervision of high risk pregnancy, antepartum 2 hr GTT next visit  2. Monochorionic diamniotic twin pregnancy, antepartum Last growth 8% discordance No signs TTTS Cont baby ASA  3. Rubella non-immune status, antepartum MMR pp  Preterm labor symptoms and general obstetric precautions including but not limited to vaginal bleeding, contractions, leaking of fluid and fetal movement were reviewed in detail with the patient. Please refer to After  Visit Summary for other counseling recommendations.   Return in about 2 weeks (around 04/21/2019) for high OB, 2 hr GTT, in person, 3rd trim labs.  Future Appointments  Date Time Provider Department Center  04/11/2019 10:00 AM WH-MFC NURSE WH-MFC MFC-US  04/11/2019 10:00 AM WH-MFC US 3 WH-MFCUS MFC-US  04/25/2019  8:45 AM WH-MFC US 5 WH-MFCUS MFC-US  04/25/2019  9:00 AM WH-MFC NURSE WH-MFC MFC-US     M , MD  

## 2019-04-11 ENCOUNTER — Ambulatory Visit (HOSPITAL_COMMUNITY): Payer: Medicaid Other | Admitting: *Deleted

## 2019-04-11 ENCOUNTER — Other Ambulatory Visit: Payer: Self-pay

## 2019-04-11 ENCOUNTER — Other Ambulatory Visit (HOSPITAL_COMMUNITY): Payer: Self-pay | Admitting: *Deleted

## 2019-04-11 ENCOUNTER — Ambulatory Visit (HOSPITAL_COMMUNITY)
Admission: RE | Admit: 2019-04-11 | Discharge: 2019-04-11 | Disposition: A | Discharge status: Home / Self Care | Payer: Medicaid Other | Source: Ambulatory Visit | Attending: Obstetrics and Gynecology | Admitting: Obstetrics and Gynecology

## 2019-04-11 ENCOUNTER — Telehealth: Payer: Self-pay

## 2019-04-11 ENCOUNTER — Encounter (HOSPITAL_COMMUNITY): Payer: Self-pay

## 2019-04-11 DIAGNOSIS — O30039 Twin pregnancy, monochorionic/diamniotic, unspecified trimester: Secondary | ICD-10-CM

## 2019-04-11 DIAGNOSIS — O0990 Supervision of high risk pregnancy, unspecified, unspecified trimester: Secondary | ICD-10-CM | POA: Insufficient documentation

## 2019-04-11 DIAGNOSIS — Z3A25 25 weeks gestation of pregnancy: Secondary | ICD-10-CM | POA: Diagnosis not present

## 2019-04-11 DIAGNOSIS — O359XX0 Maternal care for (suspected) fetal abnormality and damage, unspecified, not applicable or unspecified: Secondary | ICD-10-CM | POA: Diagnosis not present

## 2019-04-11 DIAGNOSIS — O30033 Twin pregnancy, monochorionic/diamniotic, third trimester: Secondary | ICD-10-CM

## 2019-04-11 DIAGNOSIS — O30032 Twin pregnancy, monochorionic/diamniotic, second trimester: Secondary | ICD-10-CM

## 2019-04-11 NOTE — Telephone Encounter (Addendum)
-----   Message from Sloan Leiter, MD sent at 04/08/2019  8:37 AM EST ----- Pt needs to get set up for fetal echo for mono-di twins. Thanks.   Referral placed into patients chart for front office to fax requested information to [facility] [phone].  [facility] advised me that they will call the pt with an appt and they will fax [place] pt's appt time.

## 2019-04-19 ENCOUNTER — Other Ambulatory Visit: Payer: Self-pay | Admitting: *Deleted

## 2019-04-19 DIAGNOSIS — O0990 Supervision of high risk pregnancy, unspecified, unspecified trimester: Secondary | ICD-10-CM

## 2019-04-25 ENCOUNTER — Ambulatory Visit (HOSPITAL_COMMUNITY)
Admission: RE | Admit: 2019-04-25 | Discharge: 2019-04-25 | Disposition: A | Discharge status: Home / Self Care | Payer: Medicaid Other | Source: Ambulatory Visit | Attending: Obstetrics and Gynecology | Admitting: Obstetrics and Gynecology

## 2019-04-25 ENCOUNTER — Other Ambulatory Visit: Payer: Self-pay

## 2019-04-25 ENCOUNTER — Ambulatory Visit (INDEPENDENT_AMBULATORY_CARE_PROVIDER_SITE_OTHER): Payer: Medicaid Other | Admitting: Obstetrics and Gynecology

## 2019-04-25 ENCOUNTER — Other Ambulatory Visit: Payer: Medicaid Other

## 2019-04-25 ENCOUNTER — Ambulatory Visit (HOSPITAL_COMMUNITY): Payer: Medicaid Other | Admitting: *Deleted

## 2019-04-25 ENCOUNTER — Other Ambulatory Visit (HOSPITAL_COMMUNITY): Payer: Self-pay | Admitting: *Deleted

## 2019-04-25 ENCOUNTER — Encounter (HOSPITAL_COMMUNITY): Payer: Self-pay

## 2019-04-25 DIAGNOSIS — O0990 Supervision of high risk pregnancy, unspecified, unspecified trimester: Secondary | ICD-10-CM | POA: Diagnosis not present

## 2019-04-25 DIAGNOSIS — O30039 Twin pregnancy, monochorionic/diamniotic, unspecified trimester: Secondary | ICD-10-CM

## 2019-04-25 DIAGNOSIS — O359XX0 Maternal care for (suspected) fetal abnormality and damage, unspecified, not applicable or unspecified: Secondary | ICD-10-CM

## 2019-04-25 DIAGNOSIS — O30032 Twin pregnancy, monochorionic/diamniotic, second trimester: Secondary | ICD-10-CM | POA: Insufficient documentation

## 2019-04-25 DIAGNOSIS — O0992 Supervision of high risk pregnancy, unspecified, second trimester: Secondary | ICD-10-CM

## 2019-04-25 DIAGNOSIS — Z3A27 27 weeks gestation of pregnancy: Secondary | ICD-10-CM

## 2019-04-25 DIAGNOSIS — Z362 Encounter for other antenatal screening follow-up: Secondary | ICD-10-CM | POA: Diagnosis not present

## 2019-04-25 DIAGNOSIS — O30033 Twin pregnancy, monochorionic/diamniotic, third trimester: Secondary | ICD-10-CM

## 2019-04-25 NOTE — Progress Notes (Signed)
Prenatal Visit Note Date: 04/25/2019 Clinic: [facility]  Subjective:  Leah Gray is a 23 y.o. G3P0020 at [redacted]w[redacted]d being seen today for ongoing prenatal care.  She is currently monitored for the following issues for this high-risk pregnancy and has Supervision of high risk pregnancy, antepartum; Monochorionic diamniotic twin pregnancy, antepartum; and Rubella non-immune status, antepartum on their problem list.  Patient reports no complaints.   Contractions: Not present. Vag. Bleeding: None.  Movement: Present. Denies leaking of fluid.   The following portions of the patient's history were reviewed and updated as appropriate: allergies, current medications, past family history, past medical history, past social history, past surgical history and problem list. Problem list updated.  Objective:  There were no vitals filed for this visit.  Fetal Status:     Movement: Present     General:  Alert, oriented and cooperative. Patient is in no acute distress.  Skin: Skin is warm and dry. No rash noted.   Cardiovascular: Normal heart rate noted  Respiratory: Normal respiratory effort, no problems with respiration noted  Abdomen: Soft, gravid, appropriate for gestational age. Pain/Pressure: Absent     Pelvic:  Cervical exam deferred        Extremities: Normal range of motion.     Mental Status: Normal mood and affect. Normal behavior. Normal judgment and thought content.   Urinalysis:      Assessment and Plan:  Pregnancy: G3P0020 at [redacted]w[redacted]d  1. Supervision of high risk pregnancy, antepartum Routine care.  2. Monochorionic diamniotic twin pregnancy, antepartum F/u mfm u/s today. q2wk u/s have been normal D/w her will d/w her more re: delivery planning nv Pt states she has fetal echo later on this week  Preterm labor symptoms and general obstetric precautions including but not limited to vaginal bleeding, contractions, leaking of fluid and fetal movement were reviewed  in detail with the patient. Please refer to After Visit Summary for other counseling recommendations.  Return in about 2 weeks (around 05/09/2019) for high risk, in person.   Aletha Halim, MD

## 2019-04-26 ENCOUNTER — Encounter: Payer: Self-pay | Admitting: Obstetrics and Gynecology

## 2019-04-26 ENCOUNTER — Other Ambulatory Visit: Payer: Self-pay

## 2019-04-26 DIAGNOSIS — O24419 Gestational diabetes mellitus in pregnancy, unspecified control: Secondary | ICD-10-CM

## 2019-04-26 DIAGNOSIS — Z8632 Personal history of gestational diabetes: Secondary | ICD-10-CM | POA: Insufficient documentation

## 2019-04-26 LAB — CBC
Hematocrit: 35.6 % (ref 34.0–46.6)
Hemoglobin: 11.9 g/dL (ref 11.1–15.9)
MCH: 32.2 pg (ref 26.6–33.0)
MCHC: 33.4 g/dL (ref 31.5–35.7)
MCV: 96 fL (ref 79–97)
Platelets: 311 10*3/uL (ref 150–450)
RBC: 3.7 x10E6/uL — ABNORMAL LOW (ref 3.77–5.28)
RDW: 11.7 % (ref 11.7–15.4)
WBC: 9.9 10*3/uL (ref 3.4–10.8)

## 2019-04-26 LAB — GLUCOSE TOLERANCE, 2 HOURS W/ 1HR
Glucose, 1 hour: 208 mg/dL — ABNORMAL HIGH (ref 65–179)
Glucose, 2 hour: 109 mg/dL (ref 65–152)
Glucose, Fasting: 112 mg/dL — ABNORMAL HIGH (ref 65–91)

## 2019-04-26 LAB — RPR: RPR Ser Ql: NONREACTIVE

## 2019-04-26 LAB — HIV ANTIBODY (ROUTINE TESTING W REFLEX): HIV Screen 4th Generation wRfx: NONREACTIVE

## 2019-05-02 DIAGNOSIS — O30032 Twin pregnancy, monochorionic/diamniotic, second trimester: Secondary | ICD-10-CM | POA: Diagnosis not present

## 2019-05-02 DIAGNOSIS — Z3A28 28 weeks gestation of pregnancy: Secondary | ICD-10-CM | POA: Diagnosis not present

## 2019-05-09 ENCOUNTER — Other Ambulatory Visit: Payer: Self-pay

## 2019-05-09 ENCOUNTER — Ambulatory Visit (HOSPITAL_COMMUNITY)
Admission: RE | Admit: 2019-05-09 | Discharge: 2019-05-09 | Disposition: A | Discharge status: Home / Self Care | Payer: Medicaid Other | Source: Ambulatory Visit | Attending: Obstetrics and Gynecology | Admitting: Obstetrics and Gynecology

## 2019-05-09 ENCOUNTER — Encounter (HOSPITAL_COMMUNITY): Payer: Self-pay

## 2019-05-09 ENCOUNTER — Ambulatory Visit (INDEPENDENT_AMBULATORY_CARE_PROVIDER_SITE_OTHER): Payer: Medicaid Other | Admitting: Family Medicine

## 2019-05-09 ENCOUNTER — Encounter: Payer: Medicaid Other | Admitting: Obstetrics and Gynecology

## 2019-05-09 ENCOUNTER — Ambulatory Visit (HOSPITAL_COMMUNITY): Payer: Medicaid Other | Admitting: *Deleted

## 2019-05-09 VITALS — BP 119/81 | HR 98 | Wt 175.4 lb

## 2019-05-09 DIAGNOSIS — Z3A29 29 weeks gestation of pregnancy: Secondary | ICD-10-CM

## 2019-05-09 DIAGNOSIS — O0990 Supervision of high risk pregnancy, unspecified, unspecified trimester: Secondary | ICD-10-CM | POA: Diagnosis present

## 2019-05-09 DIAGNOSIS — O30033 Twin pregnancy, monochorionic/diamniotic, third trimester: Secondary | ICD-10-CM | POA: Diagnosis not present

## 2019-05-09 DIAGNOSIS — Z23 Encounter for immunization: Secondary | ICD-10-CM

## 2019-05-09 DIAGNOSIS — O30039 Twin pregnancy, monochorionic/diamniotic, unspecified trimester: Secondary | ICD-10-CM | POA: Insufficient documentation

## 2019-05-09 DIAGNOSIS — O359XX0 Maternal care for (suspected) fetal abnormality and damage, unspecified, not applicable or unspecified: Secondary | ICD-10-CM

## 2019-05-09 DIAGNOSIS — O09899 Supervision of other high risk pregnancies, unspecified trimester: Secondary | ICD-10-CM

## 2019-05-09 DIAGNOSIS — O99891 Other specified diseases and conditions complicating pregnancy: Secondary | ICD-10-CM

## 2019-05-09 DIAGNOSIS — Z283 Underimmunization status: Secondary | ICD-10-CM

## 2019-05-09 DIAGNOSIS — O0993 Supervision of high risk pregnancy, unspecified, third trimester: Secondary | ICD-10-CM

## 2019-05-09 DIAGNOSIS — O24419 Gestational diabetes mellitus in pregnancy, unspecified control: Secondary | ICD-10-CM

## 2019-05-09 MED ORDER — ACCU-CHEK GUIDE W/DEVICE KIT: 1.0000 | PACK | Freq: Once | 0 refills | Status: AC

## 2019-05-09 MED ORDER — ACCU-CHEK GUIDE VI STRP: ORAL_STRIP | 12 refills | Status: DC

## 2019-05-09 MED ORDER — ACCU-CHEK SOFT TOUCH LANCETS MISC: 12 refills | Status: DC

## 2019-05-09 NOTE — Progress Notes (Signed)
   PRENATAL VISIT NOTE  Subjective:  Leah Gray is a 24 y.o. G3P0020 at 61w0dbeing seen today for ongoing prenatal care.  She is currently monitored for the following issues for this high-risk pregnancy and has Supervision of high risk pregnancy, antepartum; Monochorionic diamniotic twin pregnancy, antepartum; Rubella non-immune status, antepartum; and GDM (gestational diabetes mellitus) on their problem list.  Patient reports no complaints.  Contractions: Not present. Vag. Bleeding: None.  Movement: Present. Denies leaking of fluid.   The following portions of the patient's history were reviewed and updated as appropriate: allergies, current medications, past family history, past medical history, past social history, past surgical history and problem list.   Objective:   Vitals:   05/09/19 1626  BP: 119/81  Pulse: 98  Weight: 175 lb 6.4 oz (79.6 kg)    Fetal Status:     Movement: Present     General:  Alert, oriented and cooperative. Patient is in no acute distress.  Skin: Skin is warm and dry. No rash noted.   Cardiovascular: Normal heart rate noted  Respiratory: Normal respiratory effort, no problems with respiration noted  Abdomen: Soft, gravid, appropriate for gestational age.  Pain/Pressure: Absent     Pelvic: Cervical exam deferred        Extremities: Normal range of motion.  Edema: None  Mental Status: Normal mood and affect. Normal behavior. Normal judgment and thought content.   Assessment and Plan:  Pregnancy: GY3G9494at 272w0d. Supervision of high risk pregnancy, antepartum FHT and FH consistent with twins - Tdap vaccine greater than or equal to 7yo IM  2. Gestational diabetes mellitus (GDM) in third trimester, gestational diabetes method of control unspecified Discussed goals. Has education tomorrow. Testing supplies sent to pharmacy. - Blood Glucose Monitoring Suppl (ACCU-CHEK GUIDE) w/Device KIT; 1 Device by Does not apply route once for 1 dose.  Dispense: 1  kit; Refill: 0 - glucose blood (ACCU-CHEK GUIDE) test strip; Use as instructed QID  Dispense: 100 each; Refill: 12 - Lancets (ACCU-CHEK SOFT TOUCH) lancets; Use as instructed QID  Dispense: 100 each; Refill: 12  3. Monochorionic diamniotic twin pregnancy, antepartum 4% discordance [place] Vtx/vtx Continue [place]4. Rubella non-immune status, antepartum MMR post delivery  Preterm labor symptoms and general obstetric precautions including but not limited to vaginal bleeding, contractions, leaking of fluid and fetal movement were reviewed in detail with the patient. Please refer to After Visit Summary for other counseling recommendations.   No follow-ups on file.  Future Appointments  Date Time Provider [place]1/03/2020 11:15 AM [facility]1/26/2021  8:45 AM [facility]UR[place]FC-US  05/24/2019  8:45 AM WH-MFC [place] WH-MFCUS MFC-US  06/07/2019  8:15 AM WH-MFC NURSE WHClementsFC-US  06/07/2019  8:15 AM WH-MFC [place] WH-MFCUS MFC-US    JaTruett MainlandDO

## 2019-05-10 ENCOUNTER — Encounter: Payer: Medicaid Other | Attending: Obstetrics and Gynecology | Admitting: Registered"

## 2019-05-10 ENCOUNTER — Ambulatory Visit: Payer: Medicaid Other | Admitting: *Deleted

## 2019-05-10 ENCOUNTER — Other Ambulatory Visit: Payer: Self-pay | Admitting: Lactation Services

## 2019-05-10 DIAGNOSIS — Z3A00 Weeks of gestation of pregnancy not specified: Secondary | ICD-10-CM | POA: Diagnosis not present

## 2019-05-10 DIAGNOSIS — O24419 Gestational diabetes mellitus in pregnancy, unspecified control: Secondary | ICD-10-CM | POA: Insufficient documentation

## 2019-05-10 MED ORDER — ACCU-CHEK FASTCLIX LANCETS MISC: 1.0000 | Freq: Four times a day (QID) | 12 refills | Status: DC

## 2019-05-10 MED ORDER — ACCU-CHEK GUIDE W/DEVICE KIT: 1.0000 | PACK | Freq: Once | 0 refills | Status: AC

## 2019-05-10 NOTE — Progress Notes (Signed)
  Patient was seen on 05/10/2019 for Gestational Diabetes self-management. EDD 07/25/2019, she is expecting twin boys! Patient states no history of GDM. She states she works in a [employer] about 5 hour shifts now. Diet history obtained. Patient eats fair variety of all food groups with limited vegetables. Beverages include coffee, water and some Gatorade. Patient states she was charged $90 for the Accu Chek Guide Meter.The following learning objectives were met by the patient :   States the definition of Gestational Diabetes  States why dietary management is important in controlling blood glucose  Describes the effects of carbohydrates on blood glucose levels  Demonstrates ability to create a balanced meal plan  Demonstrates carbohydrate counting   States when to check blood glucose levels  Demonstrates proper blood glucose monitoring techniques  States the effect of stress and exercise on blood glucose levels  States the importance of limiting caffeine and abstaining from alcohol and smoking  I explained that I was providing her with the BIN# for the meter to be covered by Medicaid.  I called the Pharmacy and they said they did not have her Medicaid # in the system so they may not have been aware that she had Medicaid. They are adding also the BIN # so they can take care of her issue when she returns to that pharmacy.    Plan:  Aim for 3-4 Carb Choices per meal (45-60 grams) +/- 1 either way  Aim for 1-2 Carbs per snack Begin reading food labels for Total Carbohydrate of foods If OK with your MD, consider  increasing your activity level by walking, Arm Chair Exercises or other activity daily as tolerated Begin checking BG before breakfast and 2 hours after first bite of breakfast, lunch and dinner as directed by MD  Bring Log Book/Sheet and meter to every medical appointment OR use Baby Scripts (see below) Baby Scripts:  Patient was introduced to [employer] and plans to use as  record of BG electronically  Take medication if directed by MD  Patient already has a meter: Accu Chek Guide but needs to be instructed how to use it today. Her meter came with Fast Clix Lancing Device so we will put in Rx for Fast Clix Drums in place of Soft Clix lancets.I explained reason for miscommunication.  And is testing pre breakfast and 2 hours each meal as directed by MD  Patient instructed to test pre breakfast and 2 hours each meal as directed by MD  Patient instructed to monitor glucose levels: FBS: 60 - 95 mg/dl 2 hour: <120 mg/dl  Patient received the following handouts:  Nutrition Diabetes and Pregnancy  Carbohydrate Counting List  Patient will be seen for follow-up as needed.

## 2019-05-10 NOTE — Progress Notes (Signed)
New blood glucose monitoring supplies ordered per Pincus Large, Diabetes Educator.

## 2019-05-16 ENCOUNTER — Other Ambulatory Visit: Payer: Self-pay

## 2019-05-17 ENCOUNTER — Encounter: Payer: Self-pay | Admitting: *Deleted

## 2019-05-20 ENCOUNTER — Encounter: Payer: Self-pay | Admitting: Obstetrics and Gynecology

## 2019-05-20 NOTE — Progress Notes (Signed)
Reviewed data from Babyscripts, patient has not been logging CBGs 4x per day. MyChart message sent asking patient to make sure she is checking CBGs and logging them into app.     K. Meryl Cal Gindlesperger, M.D. Attending [facility] ([facility])  

## 2019-05-24 ENCOUNTER — Other Ambulatory Visit: Payer: Self-pay

## 2019-05-24 ENCOUNTER — Encounter (HOSPITAL_COMMUNITY): Payer: Self-pay

## 2019-05-24 ENCOUNTER — Ambulatory Visit (HOSPITAL_COMMUNITY): Payer: Medicaid Other | Admitting: *Deleted

## 2019-05-24 ENCOUNTER — Ambulatory Visit (HOSPITAL_COMMUNITY)
Admission: RE | Admit: 2019-05-24 | Discharge: 2019-05-24 | Disposition: A | Discharge status: Home / Self Care | Payer: Medicaid Other | Source: Ambulatory Visit | Attending: Obstetrics and Gynecology | Admitting: Obstetrics and Gynecology

## 2019-05-24 DIAGNOSIS — Z3A31 31 weeks gestation of pregnancy: Secondary | ICD-10-CM | POA: Diagnosis not present

## 2019-05-24 DIAGNOSIS — Z362 Encounter for other antenatal screening follow-up: Secondary | ICD-10-CM

## 2019-05-24 DIAGNOSIS — O30039 Twin pregnancy, monochorionic/diamniotic, unspecified trimester: Secondary | ICD-10-CM | POA: Insufficient documentation

## 2019-05-24 DIAGNOSIS — O0990 Supervision of high risk pregnancy, unspecified, unspecified trimester: Secondary | ICD-10-CM | POA: Diagnosis present

## 2019-05-24 DIAGNOSIS — O359XX0 Maternal care for (suspected) fetal abnormality and damage, unspecified, not applicable or unspecified: Secondary | ICD-10-CM | POA: Diagnosis not present

## 2019-05-24 DIAGNOSIS — O30033 Twin pregnancy, monochorionic/diamniotic, third trimester: Secondary | ICD-10-CM | POA: Diagnosis not present

## 2019-05-27 ENCOUNTER — Encounter: Payer: Medicaid Other | Admitting: Family Medicine

## 2019-06-06 ENCOUNTER — Ambulatory Visit (INDEPENDENT_AMBULATORY_CARE_PROVIDER_SITE_OTHER): Payer: Medicaid Other | Admitting: Family Medicine

## 2019-06-06 ENCOUNTER — Encounter: Payer: Self-pay | Admitting: Family Medicine

## 2019-06-06 ENCOUNTER — Other Ambulatory Visit: Payer: Self-pay

## 2019-06-06 VITALS — BP 125/73 | HR 89 | Wt 181.1 lb

## 2019-06-06 DIAGNOSIS — Z2839 Other underimmunization status: Secondary | ICD-10-CM

## 2019-06-06 DIAGNOSIS — Z3A33 33 weeks gestation of pregnancy: Secondary | ICD-10-CM

## 2019-06-06 DIAGNOSIS — O24419 Gestational diabetes mellitus in pregnancy, unspecified control: Secondary | ICD-10-CM

## 2019-06-06 DIAGNOSIS — O99891 Other specified diseases and conditions complicating pregnancy: Secondary | ICD-10-CM

## 2019-06-06 DIAGNOSIS — O30039 Twin pregnancy, monochorionic/diamniotic, unspecified trimester: Secondary | ICD-10-CM

## 2019-06-06 DIAGNOSIS — L299 Pruritus, unspecified: Secondary | ICD-10-CM | POA: Diagnosis not present

## 2019-06-06 DIAGNOSIS — Z283 Underimmunization status: Secondary | ICD-10-CM

## 2019-06-06 DIAGNOSIS — O30033 Twin pregnancy, monochorionic/diamniotic, third trimester: Secondary | ICD-10-CM

## 2019-06-06 DIAGNOSIS — O0993 Supervision of high risk pregnancy, unspecified, third trimester: Secondary | ICD-10-CM

## 2019-06-06 DIAGNOSIS — O0990 Supervision of high risk pregnancy, unspecified, unspecified trimester: Secondary | ICD-10-CM

## 2019-06-06 NOTE — Patient Instructions (Signed)

## 2019-06-06 NOTE — Progress Notes (Signed)
   PRENATAL VISIT NOTE  Subjective:  Leah Gray is a 24 y.o. G3P0020 at 67w0dbeing seen today for ongoing prenatal care.  She is currently monitored for the following issues for this high-risk pregnancy and has Supervision of high risk pregnancy, antepartum; Monochorionic diamniotic twin pregnancy, antepartum; Rubella non-immune status, antepartum; and GDM (gestational diabetes mellitus) on their problem list.  Patient reports itching.  Contractions: Not present. Vag. Bleeding: None.  Movement: Present. Denies leaking of fluid.   The following portions of the patient's history were reviewed and updated as appropriate: allergies, current medications, past family history, past medical history, past social history, past surgical history and problem list.   Objective:   Vitals:   06/06/19 1018  BP: 125/73  Pulse: 89  Weight: 181 lb 1.6 oz (82.1 kg)    Fetal Status: Fetal Heart Rate (bpm): 130/141 Fundal Height: 40 cm Movement: Present     General:  Alert, oriented and cooperative. Patient is in no acute distress.  Skin: Skin is warm and dry. No rash noted.   Cardiovascular: Normal heart rate noted  Respiratory: Normal respiratory effort, no problems with respiration noted  Abdomen: Soft, gravid, appropriate for gestational age.  Pain/Pressure: Absent     Pelvic: Cervical exam deferred        Extremities: Normal range of motion.  Edema: None  Mental Status: Normal mood and affect. Normal behavior. Normal judgment and thought content.   Assessment and Plan:  Pregnancy: G3P0020 at 328w0d. Pruritus Check bile acids, reports itching, worse at night and on palms and soles - Bile acids, total  2. Gestational diabetes mellitus (GDM) in third trimester, gestational diabetes method of control unspecified Minimal checking. Dinner recall involves "hot chocolate, cookies, Fruit". Advised protein with every meal and snack and add exercise 10 mins after every meal. Increase CBG testing. See  BabyScripts.  3. Monochorionic diamniotic twin pregnancy, antepartum Concordant growth, repeat tomorrow--should be in antepartum testing.  4. Rubella non-immune status, antepartum Needs MMR pp  5. Supervision of high risk pregnancy, antepartum Continue prenatal care.   Preterm labor symptoms and general obstetric precautions including but not limited to vaginal bleeding, contractions, leaking of fluid and fetal movement were reviewed in detail with the patient. Please refer to After Visit Summary for other counseling recommendations.   Return in 1 week (on 06/13/2019) for virtual.  Future Appointments  Date Time Provider [place]2/12/2019  8:15 AM [facility]FC-US  06/07/2019  8:15 AM WH[place] WH-MFCUS MFC-US    TaDonnamae JudeMD

## 2019-06-07 ENCOUNTER — Ambulatory Visit (HOSPITAL_COMMUNITY): Payer: Medicaid Other | Admitting: *Deleted

## 2019-06-07 ENCOUNTER — Ambulatory Visit (HOSPITAL_COMMUNITY)
Admission: RE | Admit: 2019-06-07 | Discharge: 2019-06-07 | Disposition: A | Discharge status: Home / Self Care | Payer: Medicaid Other | Source: Ambulatory Visit | Attending: Obstetrics and Gynecology | Admitting: Obstetrics and Gynecology

## 2019-06-07 ENCOUNTER — Other Ambulatory Visit (HOSPITAL_COMMUNITY): Payer: Self-pay | Admitting: *Deleted

## 2019-06-07 ENCOUNTER — Encounter (HOSPITAL_COMMUNITY): Payer: Self-pay

## 2019-06-07 ENCOUNTER — Other Ambulatory Visit (HOSPITAL_COMMUNITY): Payer: Self-pay | Admitting: Obstetrics and Gynecology

## 2019-06-07 DIAGNOSIS — O0990 Supervision of high risk pregnancy, unspecified, unspecified trimester: Secondary | ICD-10-CM | POA: Insufficient documentation

## 2019-06-07 DIAGNOSIS — Z3A33 33 weeks gestation of pregnancy: Secondary | ICD-10-CM

## 2019-06-07 DIAGNOSIS — O30033 Twin pregnancy, monochorionic/diamniotic, third trimester: Secondary | ICD-10-CM

## 2019-06-07 DIAGNOSIS — O359XX0 Maternal care for (suspected) fetal abnormality and damage, unspecified, not applicable or unspecified: Secondary | ICD-10-CM

## 2019-06-07 DIAGNOSIS — O24313 Unspecified pre-existing diabetes mellitus in pregnancy, third trimester: Secondary | ICD-10-CM | POA: Diagnosis not present

## 2019-06-07 DIAGNOSIS — O30039 Twin pregnancy, monochorionic/diamniotic, unspecified trimester: Secondary | ICD-10-CM

## 2019-06-07 LAB — BILE ACIDS, TOTAL: Bile Acids Total: 8.6 umol/L (ref 0.0–10.0)

## 2019-06-13 ENCOUNTER — Telehealth (INDEPENDENT_AMBULATORY_CARE_PROVIDER_SITE_OTHER): Payer: Medicaid Other | Admitting: Family Medicine

## 2019-06-13 DIAGNOSIS — Z283 Underimmunization status: Secondary | ICD-10-CM

## 2019-06-13 DIAGNOSIS — O30039 Twin pregnancy, monochorionic/diamniotic, unspecified trimester: Secondary | ICD-10-CM

## 2019-06-13 DIAGNOSIS — O99891 Other specified diseases and conditions complicating pregnancy: Secondary | ICD-10-CM

## 2019-06-13 DIAGNOSIS — O30033 Twin pregnancy, monochorionic/diamniotic, third trimester: Secondary | ICD-10-CM

## 2019-06-13 DIAGNOSIS — Z2839 Other underimmunization status: Secondary | ICD-10-CM

## 2019-06-13 DIAGNOSIS — O0993 Supervision of high risk pregnancy, unspecified, third trimester: Secondary | ICD-10-CM

## 2019-06-13 DIAGNOSIS — Z3A34 34 weeks gestation of pregnancy: Secondary | ICD-10-CM

## 2019-06-13 DIAGNOSIS — O24419 Gestational diabetes mellitus in pregnancy, unspecified control: Secondary | ICD-10-CM

## 2019-06-13 DIAGNOSIS — O0990 Supervision of high risk pregnancy, unspecified, unspecified trimester: Secondary | ICD-10-CM

## 2019-06-13 MED ORDER — METFORMIN HCL 500 MG PO TABS: 500.0000 mg | ORAL_TABLET | Freq: Every day | ORAL | 1 refills | Status: DC

## 2019-06-13 NOTE — Progress Notes (Signed)
I connected with  Sheppard Plumber on 06/13/19 at  3:15 PM EST by telephone and verified that I am speaking with the correct person using two identifiers.   I discussed the limitations, risks, security and privacy concerns of performing an evaluation and management service by telephone and the availability of in person appointments. I also discussed with the patient that there may be a patient responsible charge related to this service. The patient expressed understanding and agreed to proceed.  Ernestina Patches, CMA 06/13/2019  3:14 PM

## 2019-06-13 NOTE — Progress Notes (Signed)
TELEHEALTH OBSTETRICS PRENATAL VIRTUAL VIDEO VISIT ENCOUNTER NOTE  Provider location: [facility] [place]   I connected with Leah Gray on 06/13/19 at  3:15 PM EST by MyChart Video Encounter at home and verified that I am speaking with the correct person using two identifiers.   I discussed the limitations, risks, security and privacy concerns of performing an evaluation and management service virtually and the availability of in person appointments. I also discussed with the patient that there may be a patient responsible charge related to this service. The patient expressed understanding and agreed to proceed. Subjective:  Leah Gray is a 24 y.o. G3P0020 at 34w0dbeing seen today for ongoing prenatal care.  She is currently monitored for the following issues for this high-risk pregnancy and has Supervision of high risk pregnancy, antepartum; Monochorionic diamniotic twin pregnancy, antepartum; Rubella non-immune status, antepartum; and GDM (gestational diabetes mellitus) on their problem list.  Patient reports no complaints.  Contractions: Not present. Vag. Bleeding: None.  Movement: Present. Denies any leaking of fluid.   GDM: Patient diet controlled Fasting: between 100-110 2hr PP: controlled  The following portions of the patient's history were reviewed and updated as appropriate: allergies, current medications, past family history, past medical history, past social history, past surgical history and problem list.   Objective:  There were no vitals filed for this visit.  Fetal Status:     Movement: Present     General:  Alert, oriented and cooperative. Patient is in no acute distress.  Respiratory: Normal respiratory effort, no problems with respiration noted  Mental Status: Normal mood and affect. Normal behavior. Normal judgment and thought content.  Rest of physical exam deferred due to type of encounter  Imaging: [place]FM FETAL BPP WO NON  STRESS  Result Date: 06/07/2019 ----------------------------------------------------------------------  OBSTETRICS REPORT                       (Signed Final 06/07/2019 10:18 am) ---------------------------------------------------------------------- Patient Info  ID #:       [id number]                         D.O.B.:  006-01-1996(23 yrs)  Name:       [name]                 Visit Date: 06/07/2019 08:09 am ---------------------------------------------------------------------- Performed By  Performed By:     YWilnette Kales       Ref. Address:     [address]                                                              [id number] Attending:        VJohnell ComingsMD  [facility]  Referred By:      Luvenia Redden                    [place] ---------------------------------------------------------------------- Orders   #  Description                          Code         Ordered By   1  [place] MFM OB LIMITED                    76815.01     RAVI SHANKAR   2  [place] MFM FETAL BPP WO NON              76819.01     YU FANG      STRESS   3  [place] MFM FETAL BPP WO NST              76819.1      YU FANG      ADDL GESTATION  ----------------------------------------------------------------------   #  Order #                    Accession #                 Episode #   1  [id number]                  [id number]   2  [id number]                  [id number]   3  [id number]                  [id number]                  [id number]  ---------------------------------------------------------------------- Indications   Twin pregnancy, mono/di, second trimester      O30.032   Diabetes - Pregestational, 3rd trimester (on   O24.313   diet)   Fetal abnormality - other known or             O35.9XX0    suspected (Twin B - RVEIF)   Encounter for other antenatal screening        Z36.2   follow-up   Negative Carrier Screen/ low risk NIPS   [redacted] weeks gestation of pregnancy                Z3A.33  ---------------------------------------------------------------------- Vital Signs                                                 Height:        5'1"  BP:  110/64 ---------------------------------------------------------------------- Fetal Evaluation (Fetus A)  Num Of Fetuses:         2  Fetal Heart Rate(bpm):  143  Cardiac Activity:       Observed  Fetal Lie:              Maternal right side, presenting fetus  Presentation:           Cephalic  Placenta:               Right lateral  P. Cord Insertion:      Previously Visualized  Amniotic Fluid  AFI FV:      Within normal limits                              Largest Pocket(cm)                              6.93 ---------------------------------------------------------------------- Biophysical Evaluation (Fetus A)  Amniotic F.V:   Within normal limits       F. Tone:        Observed  F. Movement:    Observed                   Score:          8/8  F. Breathing:   Observed ---------------------------------------------------------------------- OB History  Gravidity:    3         Term:   0        Prem:   0        SAB:   0  TOP:          0       Ectopic:  2        Living: 0 ---------------------------------------------------------------------- Gestational Age (Fetus A)  LMP:           33w 1d        Date:  10/18/18                 EDD:   07/25/19  Best:          33w 1d     Det. By:  LMP  (10/18/18)          EDD:   07/25/19 ---------------------------------------------------------------------- Anatomy (Fetus A)  Cranium:               Previously seen        LVOT:                   Previously seen  Cavum:                 Previously seen        Aortic Arch:            Previously seen  Ventricles:            Previously seen        Ductal Arch:            Previously seen  Choroid  Plexus:        Previously seen        Diaphragm:              Previously seen  Cerebellum:            Previously seen        Stomach:  Appears normal, left                                                                        sided  Posterior Fossa:       Previously seen        Abdomen:                Appears normal  Nuchal Fold:           Previously seen        Abdominal Wall:         Appears nml (cord                                                                        insert, abd wall)  Face:                  Appears normal         Cord Vessels:           Appears normal (3                         (orbits and profile)                           vessel cord)  Lips:                  Previously seen        Kidneys:                Appear normal  Palate:                Previously seen        Bladder:                Appears normal  Thoracic:              Appears normal         Spine:                  Previously seen  Heart:                 Appears normal         Upper Extremities:      Previously seen                         (4CH, axis, and                         situs)  RVOT:                  Previously seen        Lower Extremities:      Previously seen ---------------------------------------------------------------------- Fetal Evaluation (Fetus B)  Num Of Fetuses:         2  Fetal  Heart Rate(bpm):  131  Cardiac Activity:       Observed  Fetal Lie:              Maternal left side, upper  fetus  Presentation:           Cephalic  Placenta:               Posterior  P. Cord Insertion:      Previously Visualized  Amniotic Fluid  AFI FV:      Within normal limits                              Largest Pocket(cm)                              4.35 ---------------------------------------------------------------------- Biophysical Evaluation (Fetus B)  Amniotic F.V:   Within normal limits       F. Tone:        Observed  F. Movement:    Observed                   Score:          8/8  F. Breathing:   Observed  ---------------------------------------------------------------------- Gestational Age (Fetus B)  LMP:           33w 1d        Date:  10/18/18                 EDD:   07/25/19  Best:          33w 1d     Det. By:  LMP  (10/18/18)          EDD:   07/25/19 ---------------------------------------------------------------------- Anatomy (Fetus B)  Cranium:               Previously seen        LVOT:                   Appears normal  Cavum:                 Previously seen        Aortic Arch:            Previously seen  Ventricles:            Previously seen        Ductal Arch:            Previously seen  Choroid Plexus:        Previously seen        Diaphragm:              Appears normal  Cerebellum:            Previously seen        Stomach:                Appears normal, left                                                                        sided  Posterior Fossa:       Previously seen  Abdomen:                Appears normal  Nuchal Fold:           Previously seen        Abdominal Wall:         Previously seen  Face:                  Orbits and profile     Cord Vessels:           Appears normal (3                         previously seen                                vessel cord)  Lips:                  Previously seen        Kidneys:                Appear normal  Palate:                Previously seen        Bladder:                Appears normal  Thoracic:              Appears normal         Spine:                  Previously seen  Heart:                 Previously seen        Upper Extremities:      Previously seen  RVOT:                  Previously seen        Lower Extremities:      Previously seen ---------------------------------------------------------------------- Comments  This patient was seen for a follow up exam due to a  monochorionic, diamniotic twin gestation.  She also has a  history of A1 gestational diabetes.  She denies any problems  since her last exam.  There was normal amniotic fluid noted  around both twin A  and twin B.  There were no signs of the twin to twin  transfusion syndrome noted today.  A biophysical profile performed for both twin A and twin B  was 8 out of 8.  Due to the monochorionic twin gestation, we will continue to  see her for weekly biophysical profiles.  Another biophysical profile was scheduled in 1 week. ----------------------------------------------------------------------                   Johnell Comings, MD Electronically Signed Final Report   06/07/2019 10:18 am ----------------------------------------------------------------------  [place] MFM OB FOLLOW UP  Result Date: 05/24/2019 ----------------------------------------------------------------------  OBSTETRICS REPORT                       (Signed Final 05/24/2019 09:57 am) ---------------------------------------------------------------------- Patient Info  ID #:       [id number]                          D.O.B.:  12-05-1995 (23 yrs)  Name:  [facility]                  Visit Date: 05/24/2019 08:52 am ---------------------------------------------------------------------- Performed By  Performed By:     Novella Rob        Ref. Address:     [address] [place],                                                             [place] [address]  Attending:        Sander Nephew      Location[facility]                    MD                                       [facility]  Referred By:      Luvenia Redden                    [place] ---------------------------------------------------------------------- Orders   #  Description                          Code         Ordered By   1  [place] MFM OB FOLLOW UP                  76816.01     RAVI SHANKAR   2  [place] MFM OB FOLLOW UP ADDL             93570.17     [place]  ----------------------------------------------------------------------   #  Order #                     Accession #                 Episode #   1  [id number]                  [id number]                  [id number]   2  [id number]                  [id number]                  [id number]  ---------------------------------------------------------------------- Indications   Twin pregnancy, mono/di, second trimester      O30.032   Fetal abnormality - other known or             O35.9XX0   suspected (Twin B - RVEIF)  Encounter for other antenatal screening        Z36.2   follow-up   Negative Carrier Screen/ low risk NIPS   [redacted] weeks gestation of pregnancy                Z3A.31  ---------------------------------------------------------------------- Vital Signs  Weight (lb): 175                               Height:        5'1"  BMI:         33.06 ---------------------------------------------------------------------- Fetal Evaluation (Fetus A)  Num Of Fetuses:         2  Fetal Heart Rate(bpm):  127  Cardiac Activity:       Observed  Fetal Lie:              Maternal right side  Presentation:           Cephalic  Placenta:               Posterior  P. Cord Insertion:      Previously Visualized  Amniotic Fluid  AFI FV:      Within normal limits                              Largest Pocket(cm)                              5.2 ---------------------------------------------------------------------- Biometry (Fetus A)  BPD:      81.3  mm     G. Age:  32w 5d         83  %    CI:         79.9   %    70 - 86                                                          FL/HC:      21.0   %    19.3 - 21.3  HC:      287.4  mm     G. Age:  31w 4d         25  %    HC/AC:      1.07        0.96 - 1.17  AC:      269.1  mm     G. Age:  31w 0d         43  %    FL/BPD:     74.3   %    71 - 87  FL:       60.4  mm     G. Age:  31w 3d         43  %    FL/AC:      22.4   %    20 - 24  LV:        2.7  mm  Est. FW:    1744  gm    3 lb 14 oz      44  %     FW Discordancy      0 \  5 % ---------------------------------------------------------------------- OB History   Gravidity:    3         Term:   0        Prem:   0        SAB:   0  TOP:          0       Ectopic:  2        Living: 0 ---------------------------------------------------------------------- Gestational Age (Fetus A)  LMP:           31w 1d        Date:  10/18/18                 EDD:   07/25/19  U/S Today:     31w 5d                                        EDD:   07/21/19  Best:          31w 1d     Det. By:  LMP  (10/18/18)          EDD:   07/25/19 ---------------------------------------------------------------------- Anatomy (Fetus A)  Cranium:               Appears normal         LVOT:                   Previously seen  Cavum:                 Appears normal         Aortic Arch:            Previously seen  Ventricles:            Appears normal         Ductal Arch:            Previously seen  Choroid Plexus:        Previously seen        Diaphragm:              Appears normal  Cerebellum:            Previously seen        Stomach:                Appears normal, left                                                                        sided  Posterior Fossa:       Previously seen        Abdomen:                Appears normal  Nuchal Fold:           Previously seen        Abdominal Wall:         Previously seen  Face:                  Orbits and profile     Cord Vessels:  Previously seen                         previously seen  Lips:                  Previously seen        Kidneys:                Appear normal  Palate:                Previously seen        Bladder:                Appears normal  Thoracic:              Appears normal         Spine:                  Previously seen  Heart:                 Previously seen        Upper Extremities:      Previously seen  RVOT:                  Previously seen        Lower Extremities:      Previously seen ---------------------------------------------------------------------- Fetal Evaluation (Fetus B)  Num Of Fetuses:         2  Fetal Heart Rate(bpm):  135  Cardiac  Activity:       Observed  Fetal Lie:              Maternal left side  Presentation:           Cephalic  Placenta:               Posterior  P. Cord Insertion:      Previously Visualized  Amniotic Fluid  AFI FV:      Within normal limits                              Largest Pocket(cm)                              4.91 ---------------------------------------------------------------------- Biometry (Fetus B)  BPD:      75.9  mm     G. Age:  30w 3d         20  %    CI:        72.34   %    70 - 86                                                          FL/HC:      20.2   %    19.3 - 21.3  HC:      283.9  mm     G. Age:  31w 1d         15  %    HC/AC:      1.04        0.96 - 1.17  AC:      273.8  mm     G. Age:  31w 3d         56  %    FL/BPD:     75.6   %    71 - 87  FL:       57.4  mm     G. Age:  30w 1d         12  %    FL/AC:      21.0   %    20 - 24  LV:        3.7  mm  Est. FW:    1664  gm    3 lb 11 oz      30  %     FW Discordancy         5  % ---------------------------------------------------------------------- Gestational Age (Fetus B)  LMP:           31w 1d        Date:  10/18/18                 EDD:   07/25/19  U/S Today:     30w 6d                                        EDD:   07/27/19  Best:          31w 1d     Det. By:  LMP  (10/18/18)          EDD:   07/25/19 ---------------------------------------------------------------------- Anatomy (Fetus B)  Cranium:               Appears normal         LVOT:                   Previously seen  Cavum:                 Appears normal         Aortic Arch:            Previously seen  Ventricles:            Appears normal         Ductal Arch:            Previously seen  Choroid Plexus:        Previously seen        Diaphragm:              Appears normal  Cerebellum:            Previously seen        Stomach:                Appears normal, left                                                                        sided  Posterior Fossa:       Previously seen         Abdomen:                Appears normal  Nuchal Fold:  Previously seen        Abdominal Wall:         Previously seen  Face:                  Orbits and profile     Cord Vessels:           Previously seen                         previously seen  Lips:                  Previously seen        Kidneys:                Appear normal  Palate:                Previously seen        Bladder:                Appears normal  Thoracic:              Appears normal         Spine:                  Previously seen  Heart:                 Previously seen        Upper Extremities:      Previously seen  RVOT:                  Previously seen        Lower Extremities:      Previously seen ---------------------------------------------------------------------- Cervix Uterus Adnexa  Cervix  Not visualized (advanced GA >24wks) ---------------------------------------------------------------------- Impression  Monochoronic diamniotic twin pregnancy  Normal interval growth with a 5% growth discordance  Good amniotic fluid, bladder and stomach  No signs or symptoms of TTTS or TAPS  Good fetal movement bilaterally ---------------------------------------------------------------------- Recommendations  Follow up TTTS/TAPS screening in 2 weeks  Repeat growth in 4 weeks ----------------------------------------------------------------------               Sander Nephew, MD Electronically Signed Final Report   05/24/2019 09:57 am ----------------------------------------------------------------------  [place] MFM OB FOLLOW UP ADDL GEST  Result Date: 05/24/2019 ----------------------------------------------------------------------  OBSTETRICS REPORT                       (Signed Final 05/24/2019 09:57 am) ---------------------------------------------------------------------- Patient Info  ID #:       [id number]                          D.O.B.:  1996-03-07 (23 yrs)  Name:       Leah Gray                  Visit Date: 05/24/2019 08:52 am  ---------------------------------------------------------------------- Performed By  Performed By:     Novella Rob        Ref. Address:     [address]  [place],                                                             [place] [address]  Attending:        Sander Nephew      [facility]                    MD                                       [facility]  Referred By:      Luvenia Redden                    [place] ---------------------------------------------------------------------- Orders   #  Description                          Code         Ordered By   1  [place] MFM OB FOLLOW UP                  76816.01     RAVI SHANKAR   2  [place] MFM OB FOLLOW UP ADDL             37048.88     [place]  ----------------------------------------------------------------------   #  Order #                    Accession #                 Episode #   1  [id number]                  [id number]                  [id number]   2  [id number]                  [id number]                  [id number]  ---------------------------------------------------------------------- Indications   Twin pregnancy, mono/di, second trimester      O30.032   Fetal abnormality - other known or             O35.9XX0   suspected (Twin B - RVEIF)   Encounter for other antenatal screening        Z36.2   follow-up   Negative Carrier Screen/ low risk NIPS   [redacted] weeks gestation of pregnancy                Z3A.31  ---------------------------------------------------------------------- Vital Signs  Weight (lb): 175                               Height:        5'1"  BMI:         33.06 ---------------------------------------------------------------------- Fetal Evaluation (Fetus A)  Num Of Fetuses:         2  Fetal Heart Rate(bpm):  127  Cardiac  Activity:       Observed  Fetal Lie:              Maternal right side  Presentation:           Cephalic   Placenta:               Posterior  P. Cord Insertion:      Previously Visualized  Amniotic Fluid  AFI FV:      Within normal limits                              Largest Pocket(cm)                              5.2 ---------------------------------------------------------------------- Biometry (Fetus A)  BPD:      81.3  mm     G. Age:  32w 5d         83  %    CI:         79.9   %    70 - 86                                                          FL/HC:      21.0   %    19.3 - 21.3  HC:      287.4  mm     G. Age:  31w 4d         25  %    HC/AC:      1.07        0.96 - 1.17  AC:      269.1  mm     G. Age:  31w 0d         43  %    FL/BPD:     74.3   %    71 - 87  FL:       60.4  mm     G. Age:  31w 3d         43  %    FL/AC:      22.4   %    20 - 24  LV:        2.7  mm  Est. FW:    1744  gm    3 lb 14 oz      44  %     FW Discordancy      0 \ 5 % ---------------------------------------------------------------------- OB History  Gravidity:    3         Term:   0        Prem:   0        SAB:   0  TOP:          0       Ectopic:  2        Living: 0 ---------------------------------------------------------------------- Gestational Age (Fetus A)  LMP:           31w 1d        Date:  10/18/18                 EDD:   07/25/19  U/S Today:  31w 5d                                        EDD:   07/21/19  Best:          31w 1d     Det. By:  LMP  (10/18/18)          EDD:   07/25/19 ---------------------------------------------------------------------- Anatomy (Fetus A)  Cranium:               Appears normal         LVOT:                   Previously seen  Cavum:                 Appears normal         Aortic Arch:            Previously seen  Ventricles:            Appears normal         Ductal Arch:            Previously seen  Choroid Plexus:        Previously seen        Diaphragm:              Appears normal  Cerebellum:            Previously seen        Stomach:                Appears normal, left                                                                         sided  Posterior Fossa:       Previously seen        Abdomen:                Appears normal  Nuchal Fold:           Previously seen        Abdominal Wall:         Previously seen  Face:                  Orbits and profile     Cord Vessels:           Previously seen                         previously seen  Lips:                  Previously seen        Kidneys:                Appear normal  Palate:                Previously seen        Bladder:                Appears normal  Thoracic:  Appears normal         Spine:                  Previously seen  Heart:                 Previously seen        Upper Extremities:      Previously seen  RVOT:                  Previously seen        Lower Extremities:      Previously seen ---------------------------------------------------------------------- Fetal Evaluation (Fetus B)  Num Of Fetuses:         2  Fetal Heart Rate(bpm):  135  Cardiac Activity:       Observed  Fetal Lie:              Maternal left side  Presentation:           Cephalic  Placenta:               Posterior  P. Cord Insertion:      Previously Visualized  Amniotic Fluid  AFI FV:      Within normal limits                              Largest Pocket(cm)                              4.91 ---------------------------------------------------------------------- Biometry (Fetus B)  BPD:      75.9  mm     G. Age:  30w 3d         20  %    CI:        72.34   %    70 - 86                                                          FL/HC:      20.2   %    19.3 - 21.3  HC:      283.9  mm     G. Age:  31w 1d         15  %    HC/AC:      1.04        0.96 - 1.17  AC:      273.8  mm     G. Age:  31w 3d         71  %    FL/BPD:     75.6   %    71 - 87  FL:       57.4  mm     G. Age:  30w 1d         12  %    FL/AC:      21.0   %    20 - 24  LV:        3.7  mm  Est. FW:    1664  gm    3 lb 11 oz      30  %     FW Discordancy         5  %  ----------------------------------------------------------------------  Gestational Age (Fetus B)  LMP:           31w 1d        Date:  10/18/18                 EDD:   07/25/19  U/S Today:     30w 6d                                        EDD:   07/27/19  Best:          31w 1d     Det. By:  LMP  (10/18/18)          EDD:   07/25/19 ---------------------------------------------------------------------- Anatomy (Fetus B)  Cranium:               Appears normal         LVOT:                   Previously seen  Cavum:                 Appears normal         Aortic Arch:            Previously seen  Ventricles:            Appears normal         Ductal Arch:            Previously seen  Choroid Plexus:        Previously seen        Diaphragm:              Appears normal  Cerebellum:            Previously seen        Stomach:                Appears normal, left                                                                        sided  Posterior Fossa:       Previously seen        Abdomen:                Appears normal  Nuchal Fold:           Previously seen        Abdominal Wall:         Previously seen  Face:                  Orbits and profile     Cord Vessels:           Previously seen                         previously seen  Lips:                  Previously seen        Kidneys:                Appear normal  Palate:  Previously seen        Bladder:                Appears normal  Thoracic:              Appears normal         Spine:                  Previously seen  Heart:                 Previously seen        Upper Extremities:      Previously seen  RVOT:                  Previously seen        Lower Extremities:      Previously seen ---------------------------------------------------------------------- Cervix Uterus Adnexa  Cervix  Not visualized (advanced GA >24wks) ---------------------------------------------------------------------- Impression  Monochoronic diamniotic twin pregnancy  Normal interval  growth with a 5% growth discordance  Good amniotic fluid, bladder and stomach  No signs or symptoms of TTTS or TAPS  Good fetal movement bilaterally ---------------------------------------------------------------------- Recommendations  Follow up TTTS/TAPS screening in 2 weeks  Repeat growth in 4 weeks ----------------------------------------------------------------------               Sander Nephew, MD Electronically Signed Final Report   05/24/2019 09:57 am ----------------------------------------------------------------------  [place] MFM OB LIMITED  Result Date: 06/07/2019 ----------------------------------------------------------------------  OBSTETRICS REPORT                       (Signed Final 06/07/2019 10:18 am) ---------------------------------------------------------------------- Patient Info  ID #:       [id number]                          D.O.B.:  06/19/1995 (23 yrs)  Name:       Leah Gray                  Visit Date: 06/07/2019 08:09 am ---------------------------------------------------------------------- Performed By  Performed By:     Wilnette Kales        Ref. Address:     [address] [place],                                                             [place] [address]  Attending:        Johnell Comings MD         [facility]  Referred By:      Luvenia Redden                    [place] ---------------------------------------------------------------------- Orders   #  Description                          Code         Ordered By   1  [place] MFM OB LIMITED                    76815.01     RAVI SHANKAR   2  [place] MFM FETAL BPP WO NON              76819.01     YU FANG      STRESS   3  [place] MFM FETAL BPP WO NST              76819.1      YU FANG      ADDL GESTATION   ----------------------------------------------------------------------   #  Order #                    Accession #                 Episode #   1  [id number]                  [id number]   2  [id number]                  [id number]                  [id number]   3  [id number]                  [id number]                  [id number]  ---------------------------------------------------------------------- Indications   Twin pregnancy, mono/di, second trimester      O30.032   Diabetes - Pregestational, 3rd trimester (on   O24.313   diet)   Fetal abnormality - other known or             O35.9XX0   suspected (Twin B - RVEIF)   Encounter for other antenatal screening        Z36.2   follow-up   Negative Carrier Screen/ low risk NIPS   [redacted] weeks gestation of pregnancy                Z3A.33  ---------------------------------------------------------------------- Vital Signs                                                 Height:        5'1"  BP:          110/64 ---------------------------------------------------------------------- Fetal Evaluation (Fetus A)  Num Of Fetuses:         2  Fetal Heart Rate(bpm):  143  Cardiac Activity:       Observed  Fetal Lie:              Maternal right side, presenting fetus  Presentation:  Cephalic  Placenta:               Right lateral  P. Cord Insertion:      Previously Visualized  Amniotic Fluid  AFI FV:      Within normal limits                              Largest Pocket(cm)                              6.93 ---------------------------------------------------------------------- Biophysical Evaluation (Fetus A)  Amniotic F.V:   Within normal limits       F. Tone:        Observed  F. Movement:    Observed                   Score:          8/8  F. Breathing:   Observed ---------------------------------------------------------------------- OB History  Gravidity:    3         Term:   0        Prem:   0        SAB:   0  TOP:          0       Ectopic:  2        Living: 0  ---------------------------------------------------------------------- Gestational Age (Fetus A)  LMP:           33w 1d        Date:  10/18/18                 EDD:   07/25/19  Best:          33w 1d     Det. By:  LMP  (10/18/18)          EDD:   07/25/19 ---------------------------------------------------------------------- Anatomy (Fetus A)  Cranium:               Previously seen        LVOT:                   Previously seen  Cavum:                 Previously seen        Aortic Arch:            Previously seen  Ventricles:            Previously seen        Ductal Arch:            Previously seen  Choroid Plexus:        Previously seen        Diaphragm:              Previously seen  Cerebellum:            Previously seen        Stomach:                Appears normal, left  sided  Posterior Fossa:       Previously seen        Abdomen:                Appears normal  Nuchal Fold:           Previously seen        Abdominal Wall:         Appears nml (cord                                                                        insert, abd wall)  Face:                  Appears normal         Cord Vessels:           Appears normal (3                         (orbits and profile)                           vessel cord)  Lips:                  Previously seen        Kidneys:                Appear normal  Palate:                Previously seen        Bladder:                Appears normal  Thoracic:              Appears normal         Spine:                  Previously seen  Heart:                 Appears normal         Upper Extremities:      Previously seen                         (4CH, axis, and                         situs)  RVOT:                  Previously seen        Lower Extremities:      Previously seen ---------------------------------------------------------------------- Fetal Evaluation (Fetus B)  Num Of Fetuses:         2  Fetal Heart Rate(bpm):   131  Cardiac Activity:       Observed  Fetal Lie:              Maternal left side, upper  fetus  Presentation:           Cephalic  Placenta:               Posterior  P. Cord Insertion:  Previously Visualized  Amniotic Fluid  AFI FV:      Within normal limits                              Largest Pocket(cm)                              4.35 ---------------------------------------------------------------------- Biophysical Evaluation (Fetus B)  Amniotic F.V:   Within normal limits       F. Tone:        Observed  F. Movement:    Observed                   Score:          8/8  F. Breathing:   Observed ---------------------------------------------------------------------- Gestational Age (Fetus B)  LMP:           33w 1d        Date:  10/18/18                 EDD:   07/25/19  Best:          33w 1d     Det. By:  LMP  (10/18/18)          EDD:   07/25/19 ---------------------------------------------------------------------- Anatomy (Fetus B)  Cranium:               Previously seen        LVOT:                   Appears normal  Cavum:                 Previously seen        Aortic Arch:            Previously seen  Ventricles:            Previously seen        Ductal Arch:            Previously seen  Choroid Plexus:        Previously seen        Diaphragm:              Appears normal  Cerebellum:            Previously seen        Stomach:                Appears normal, left                                                                        sided  Posterior Fossa:       Previously seen        Abdomen:                Appears normal  Nuchal Fold:           Previously seen        Abdominal Wall:         Previously seen  Face:                  Orbits  and profile     Cord Vessels:           Appears normal (3                         previously seen                                vessel cord)  Lips:                  Previously seen        Kidneys:                Appear normal  Palate:                Previously seen         Bladder:                Appears normal  Thoracic:              Appears normal         Spine:                  Previously seen  Heart:                 Previously seen        Upper Extremities:      Previously seen  RVOT:                  Previously seen        Lower Extremities:      Previously seen ---------------------------------------------------------------------- Comments  This patient was seen for a follow up exam due to a  monochorionic, diamniotic twin gestation.  She also has a  history of A1 gestational diabetes.  She denies any problems  since her last exam.  There was normal amniotic fluid noted around both twin A  and twin B.  There were no signs of the twin to twin  transfusion syndrome noted today.  A biophysical profile performed for both twin A and twin B  was 8 out of 8.  Due to the monochorionic twin gestation, we will continue to  see her for weekly biophysical profiles.  Another biophysical profile was scheduled in 1 week. ----------------------------------------------------------------------                   Johnell Comings, MD Electronically Signed Final Report   06/07/2019 10:18 am ----------------------------------------------------------------------  [place] MFM FETAL BPP WO NST ADDL GESTATION  Result Date: 06/07/2019 ----------------------------------------------------------------------  OBSTETRICS REPORT                       (Signed Final 06/07/2019 10:18 am) ---------------------------------------------------------------------- Patient Info  ID #:       [id number]                          D.O.B.:  [birth year] (23 yrs)  Name:       Leah Gray                  Visit Date: 06/07/2019 08:09 am ---------------------------------------------------------------------- Performed By  Performed By:     Wilnette Kales        Ref. Address:     [address]  [place]                                                             [place],                                                              [place] [address]  Attending:        Johnell Comings MD         [facility]  Referred By:      Luvenia Redden                    [place] ---------------------------------------------------------------------- Orders   #  Description                          Code         Ordered By   1  [place] MFM OB LIMITED                    76815.01     RAVI SHANKAR   2  [place] MFM FETAL BPP WO NON              76819.01     YU FANG      STRESS   3  [place] MFM FETAL BPP WO NST              82423.5      YU FANG      ADDL GESTATION  ----------------------------------------------------------------------   #  Order #                    Accession #                 Episode #   1  [id number]                  [id number]                  [id number]   2  [id number]                  [id number]                  [id number]   3  [id number]                  [id number]                  [id number]  ---------------------------------------------------------------------- Indications   Twin pregnancy, mono/di, second trimester      O30.032   Diabetes - Pregestational, 3rd trimester (on   O24.313  diet)   Fetal abnormality - other known or             O35.9XX0   suspected (Twin B - RVEIF)   Encounter for other antenatal screening        Z36.2   follow-up   Negative Carrier Screen/ low risk NIPS   [redacted] weeks gestation of pregnancy                Z3A.33  ---------------------------------------------------------------------- Vital Signs                                                 Height:        5'1"  BP:          110/64 ---------------------------------------------------------------------- Fetal Evaluation (Fetus A)  Num Of Fetuses:         2  Fetal Heart Rate(bpm):  143  Cardiac Activity:       Observed  Fetal Lie:              Maternal right side, presenting fetus  Presentation:           Cephalic  Placenta:               Right lateral  P. Cord Insertion:      Previously  Visualized  Amniotic Fluid  AFI FV:      Within normal limits                              Largest Pocket(cm)                              6.93 ---------------------------------------------------------------------- Biophysical Evaluation (Fetus A)  Amniotic F.V:   Within normal limits       F. Tone:        Observed  F. Movement:    Observed                   Score:          8/8  F. Breathing:   Observed ---------------------------------------------------------------------- OB History  Gravidity:    3         Term:   0        Prem:   0        SAB:   0  TOP:          0       Ectopic:  2        Living: 0 ---------------------------------------------------------------------- Gestational Age (Fetus A)  LMP:           33w 1d        Date:  10/18/18                 EDD:   07/25/19  Best:          33w 1d     Det. By:  LMP  (10/18/18)          EDD:   07/25/19 ---------------------------------------------------------------------- Anatomy (Fetus A)  Cranium:               Previously seen        LVOT:  Previously seen  Cavum:                 Previously seen        Aortic Arch:            Previously seen  Ventricles:            Previously seen        Ductal Arch:            Previously seen  Choroid Plexus:        Previously seen        Diaphragm:              Previously seen  Cerebellum:            Previously seen        Stomach:                Appears normal, left                                                                        sided  Posterior Fossa:       Previously seen        Abdomen:                Appears normal  Nuchal Fold:           Previously seen        Abdominal Wall:         Appears nml (cord                                                                        insert, abd wall)  Face:                  Appears normal         Cord Vessels:           Appears normal (3                         (orbits and profile)                           vessel cord)  Lips:                  Previously seen         Kidneys:                Appear normal  Palate:                Previously seen        Bladder:                Appears normal  Thoracic:              Appears normal         Spine:  Previously seen  Heart:                 Appears normal         Upper Extremities:      Previously seen                         (4CH, axis, and                         situs)  RVOT:                  Previously seen        Lower Extremities:      Previously seen ---------------------------------------------------------------------- Fetal Evaluation (Fetus B)  Num Of Fetuses:         2  Fetal Heart Rate(bpm):  131  Cardiac Activity:       Observed  Fetal Lie:              Maternal left side, upper  fetus  Presentation:           Cephalic  Placenta:               Posterior  P. Cord Insertion:      Previously Visualized  Amniotic Fluid  AFI FV:      Within normal limits                              Largest Pocket(cm)                              4.35 ---------------------------------------------------------------------- Biophysical Evaluation (Fetus B)  Amniotic F.V:   Within normal limits       F. Tone:        Observed  F. Movement:    Observed                   Score:          8/8  F. Breathing:   Observed ---------------------------------------------------------------------- Gestational Age (Fetus B)  LMP:           33w 1d        Date:  10/18/18                 EDD:   07/25/19  Best:          33w 1d     Det. By:  LMP  (10/18/18)          EDD:   07/25/19 ---------------------------------------------------------------------- Anatomy (Fetus B)  Cranium:               Previously seen        LVOT:                   Appears normal  Cavum:                 Previously seen        Aortic Arch:            Previously seen  Ventricles:            Previously seen        Ductal Arch:            Previously seen  Choroid Plexus:        Previously seen  Diaphragm:              Appears normal  Cerebellum:            Previously seen         Stomach:                Appears normal, left                                                                        sided  Posterior Fossa:       Previously seen        Abdomen:                Appears normal  Nuchal Fold:           Previously seen        Abdominal Wall:         Previously seen  Face:                  Orbits and profile     Cord Vessels:           Appears normal (3                         previously seen                                vessel cord)  Lips:                  Previously seen        Kidneys:                Appear normal  Palate:                Previously seen        Bladder:                Appears normal  Thoracic:              Appears normal         Spine:                  Previously seen  Heart:                 Previously seen        Upper Extremities:      Previously seen  RVOT:                  Previously seen        Lower Extremities:      Previously seen ---------------------------------------------------------------------- Comments  This patient was seen for a follow up exam due to a  monochorionic, diamniotic twin gestation.  She also has a  history of A1 gestational diabetes.  She denies any problems  since her last exam.  There was normal amniotic fluid noted around both twin A  and twin B.  There were no signs of the twin to twin  transfusion syndrome noted today.  A biophysical profile performed for both twin A and twin B  was 8 out of 8.  Due to  the monochorionic twin gestation, we will continue to  see her for weekly biophysical profiles.  Another biophysical profile was scheduled in 1 week. ----------------------------------------------------------------------                   Johnell Comings, MD Electronically Signed Final Report   06/07/2019 10:18 am ----------------------------------------------------------------------   Assessment and Plan:  Pregnancy: [id number] at 91w0d1. Supervision of high risk pregnancy, antepartum Good fetal movement  2. Gestational diabetes  mellitus (GDM) in third trimester, gestational diabetes method of control unspecified Start metformin at night  3. Monochorionic diamniotic twin pregnancy, antepartum Delivery by 37 weeks GBS next appointment  4. Rubella non-immune status, antepartum MMR post delivery  Preterm labor symptoms and general obstetric precautions including but not limited to vaginal bleeding, contractions, leaking of fluid and fetal movement were reviewed in detail with the patient. I discussed the assessment and treatment plan with the patient. The patient was provided an opportunity to ask questions and all were answered. The patient agreed with the plan and demonstrated an understanding of the instructions. The patient was advised to call back or seek an in-person office evaluation/[facility]. Please refer to After Visit Summary for other counseling recommendations.   I provided 13 minutes of face-to-face time during this encounter.  No follow-ups on file.  Future Appointments  Date Time Provider DBucks 06/14/2019  7:30 AM [facility]  06/14/2019  7:30 AM WH-MFC [place]5 WH-MFCUS MFC-US  06/21/2019  9:00 AM WH-MFC NURSE WH-MFC MFC-US  06/21/2019  9:00 AM WH-MFC [place] WH-MFCUS MFC-US  06/28/2019 10:30 AM WH-MFC NURSE [facility]  06/28/2019 10:30 AM WH-MFC [place]5 WH-MFCUS MFC-US    Kaylah Chiasson J Danique Hartsough, DO [facility]

## 2019-06-13 NOTE — Progress Notes (Signed)
I connected with  Sheppard Plumber on 06/13/19 at  3:15 PM EST by telephone and verified that I am speaking with the correct person using two identifiers.   I discussed the limitations, risks, security and privacy concerns of performing an evaluation and management service by telephone and the availability of in person appointments. I also discussed with the patient that there may be a patient responsible charge related to this service. The patient expressed understanding and agreed to proceed.  Ernestina Patches, CMA 06/13/2019  3:13 PM

## 2019-06-14 ENCOUNTER — Encounter (HOSPITAL_COMMUNITY): Payer: Self-pay

## 2019-06-14 ENCOUNTER — Ambulatory Visit (HOSPITAL_COMMUNITY)
Admission: RE | Admit: 2019-06-14 | Discharge: 2019-06-14 | Disposition: A | Discharge status: Home / Self Care | Payer: Medicaid Other | Source: Ambulatory Visit | Attending: Obstetrics and Gynecology | Admitting: Obstetrics and Gynecology

## 2019-06-14 ENCOUNTER — Other Ambulatory Visit: Payer: Self-pay

## 2019-06-14 ENCOUNTER — Ambulatory Visit (HOSPITAL_COMMUNITY): Payer: Medicaid Other | Admitting: *Deleted

## 2019-06-14 DIAGNOSIS — Z3A34 34 weeks gestation of pregnancy: Secondary | ICD-10-CM

## 2019-06-14 DIAGNOSIS — O30039 Twin pregnancy, monochorionic/diamniotic, unspecified trimester: Secondary | ICD-10-CM | POA: Insufficient documentation

## 2019-06-14 DIAGNOSIS — O0990 Supervision of high risk pregnancy, unspecified, unspecified trimester: Secondary | ICD-10-CM

## 2019-06-14 DIAGNOSIS — O30033 Twin pregnancy, monochorionic/diamniotic, third trimester: Secondary | ICD-10-CM | POA: Diagnosis not present

## 2019-06-21 ENCOUNTER — Ambulatory Visit (HOSPITAL_COMMUNITY): Payer: Medicaid Other | Admitting: *Deleted

## 2019-06-21 ENCOUNTER — Encounter (HOSPITAL_COMMUNITY): Payer: Self-pay

## 2019-06-21 ENCOUNTER — Other Ambulatory Visit: Payer: Self-pay

## 2019-06-21 ENCOUNTER — Ambulatory Visit (HOSPITAL_BASED_OUTPATIENT_CLINIC_OR_DEPARTMENT_OTHER)
Admission: RE | Admit: 2019-06-21 | Discharge: 2019-06-21 | Disposition: A | Discharge status: Home / Self Care | Payer: Medicaid Other | Source: Ambulatory Visit | Attending: Obstetrics and Gynecology | Admitting: Obstetrics and Gynecology

## 2019-06-21 DIAGNOSIS — O30039 Twin pregnancy, monochorionic/diamniotic, unspecified trimester: Secondary | ICD-10-CM | POA: Insufficient documentation

## 2019-06-21 DIAGNOSIS — O0990 Supervision of high risk pregnancy, unspecified, unspecified trimester: Secondary | ICD-10-CM | POA: Insufficient documentation

## 2019-06-21 DIAGNOSIS — O30033 Twin pregnancy, monochorionic/diamniotic, third trimester: Secondary | ICD-10-CM

## 2019-06-21 DIAGNOSIS — Z362 Encounter for other antenatal screening follow-up: Secondary | ICD-10-CM | POA: Diagnosis not present

## 2019-06-21 DIAGNOSIS — Z3A35 35 weeks gestation of pregnancy: Secondary | ICD-10-CM | POA: Diagnosis not present

## 2019-06-22 ENCOUNTER — Other Ambulatory Visit: Payer: Self-pay

## 2019-06-22 ENCOUNTER — Encounter: Payer: Self-pay | Admitting: Obstetrics & Gynecology

## 2019-06-22 ENCOUNTER — Inpatient Hospital Stay (HOSPITAL_COMMUNITY)
Admission: EM | Admit: 2019-06-22 | Discharge: 2019-06-26 | DRG: 788 | Disposition: A | Discharge status: Home / Self Care | Payer: Medicaid Other | Attending: Obstetrics and Gynecology | Admitting: Obstetrics and Gynecology

## 2019-06-22 ENCOUNTER — Other Ambulatory Visit (HOSPITAL_COMMUNITY)
Admission: RE | Admit: 2019-06-22 | Discharge: 2019-06-22 | Disposition: A | Discharge status: Home / Self Care | Payer: Medicaid Other | Source: Ambulatory Visit | Attending: Obstetrics & Gynecology | Admitting: Obstetrics & Gynecology

## 2019-06-22 ENCOUNTER — Ambulatory Visit (INDEPENDENT_AMBULATORY_CARE_PROVIDER_SITE_OTHER): Payer: Medicaid Other | Admitting: Obstetrics & Gynecology

## 2019-06-22 ENCOUNTER — Inpatient Hospital Stay (HOSPITAL_COMMUNITY)
Admission: AD | Admit: 2019-06-22 | Payer: Medicaid Other | Source: Home / Self Care | Admitting: Obstetrics & Gynecology

## 2019-06-22 VITALS — BP 119/75 | HR 91 | Wt 183.0 lb

## 2019-06-22 DIAGNOSIS — O0990 Supervision of high risk pregnancy, unspecified, unspecified trimester: Secondary | ICD-10-CM

## 2019-06-22 DIAGNOSIS — O24425 Gestational diabetes mellitus in childbirth, controlled by oral hypoglycemic drugs: Secondary | ICD-10-CM | POA: Diagnosis present

## 2019-06-22 DIAGNOSIS — O24420 Gestational diabetes mellitus in childbirth, diet controlled: Secondary | ICD-10-CM | POA: Diagnosis not present

## 2019-06-22 DIAGNOSIS — O42913 Preterm premature rupture of membranes, unspecified as to length of time between rupture and onset of labor, third trimester: Principal | ICD-10-CM | POA: Diagnosis present

## 2019-06-22 DIAGNOSIS — O30033 Twin pregnancy, monochorionic/diamniotic, third trimester: Secondary | ICD-10-CM | POA: Diagnosis not present

## 2019-06-22 DIAGNOSIS — Z87891 Personal history of nicotine dependence: Secondary | ICD-10-CM | POA: Diagnosis not present

## 2019-06-22 DIAGNOSIS — O24419 Gestational diabetes mellitus in pregnancy, unspecified control: Secondary | ICD-10-CM

## 2019-06-22 DIAGNOSIS — Z8632 Personal history of gestational diabetes: Secondary | ICD-10-CM | POA: Diagnosis present

## 2019-06-22 DIAGNOSIS — O30039 Twin pregnancy, monochorionic/diamniotic, unspecified trimester: Secondary | ICD-10-CM

## 2019-06-22 DIAGNOSIS — Z283 Underimmunization status: Secondary | ICD-10-CM

## 2019-06-22 DIAGNOSIS — O42919 Preterm premature rupture of membranes, unspecified as to length of time between rupture and onset of labor, unspecified trimester: Secondary | ICD-10-CM | POA: Diagnosis present

## 2019-06-22 DIAGNOSIS — Z2839 Other underimmunization status: Secondary | ICD-10-CM

## 2019-06-22 DIAGNOSIS — Z8759 Personal history of other complications of pregnancy, childbirth and the puerperium: Secondary | ICD-10-CM | POA: Diagnosis present

## 2019-06-22 DIAGNOSIS — Z3A35 35 weeks gestation of pregnancy: Secondary | ICD-10-CM

## 2019-06-22 DIAGNOSIS — O09899 Supervision of other high risk pregnancies, unspecified trimester: Secondary | ICD-10-CM

## 2019-06-22 DIAGNOSIS — Z20822 Contact with and (suspected) exposure to covid-19: Secondary | ICD-10-CM | POA: Diagnosis not present

## 2019-06-22 LAB — CBC
HCT: 37.6 % (ref 36.0–46.0)
Hemoglobin: 12.4 g/dL (ref 12.0–15.0)
MCH: 31.3 pg (ref 26.0–34.0)
MCHC: 33 g/dL (ref 30.0–36.0)
MCV: 94.9 fL (ref 80.0–100.0)
Platelets: 356 10*3/uL (ref 150–400)
RBC: 3.96 MIL/uL (ref 3.87–5.11)
RDW: 13.2 % (ref 11.5–15.5)
WBC: 8.4 10*3/uL (ref 4.0–10.5)
nRBC: 0 % (ref 0.0–0.2)

## 2019-06-22 LAB — GLUCOSE, CAPILLARY
Glucose-Capillary: 102 mg/dL — ABNORMAL HIGH (ref 70–99)
Glucose-Capillary: 108 mg/dL — ABNORMAL HIGH (ref 70–99)
Glucose-Capillary: 113 mg/dL — ABNORMAL HIGH (ref 70–99)
Glucose-Capillary: 131 mg/dL — ABNORMAL HIGH (ref 70–99)

## 2019-06-22 LAB — RESPIRATORY PANEL BY RT PCR (FLU A&B, COVID)
Influenza A by PCR: NEGATIVE
Influenza B by PCR: NEGATIVE
SARS Coronavirus 2 by RT PCR: NEGATIVE

## 2019-06-22 LAB — TYPE AND SCREEN
ABO/RH(D): O POS
Antibody Screen: NEGATIVE

## 2019-06-22 MED ORDER — LIDOCAINE HCL (PF) 1 % IJ SOLN: 30.0000 mL | INTRAMUSCULAR | Status: DC | PRN

## 2019-06-22 MED ORDER — LACTATED RINGERS IV SOLN: 500.0000 mL | Freq: Once | INTRAVENOUS | Status: DC

## 2019-06-22 MED ORDER — PENICILLIN G POT IN DEXTROSE 60000 UNIT/ML IV SOLN
3.0000 10*6.[IU] | INTRAVENOUS | Status: DC
  Administered 2019-06-22 – 2019-06-24 (×9): 3 10*6.[IU] via INTRAVENOUS
  Filled 2019-06-22 (×9): qty 50

## 2019-06-22 MED ORDER — ACETAMINOPHEN 325 MG PO TABS: 650.0000 mg | ORAL_TABLET | ORAL | Status: DC | PRN

## 2019-06-22 MED ORDER — TERBUTALINE SULFATE 1 MG/ML IJ SOLN: 0.2500 mg | Freq: Once | INTRAMUSCULAR | Status: DC | PRN

## 2019-06-22 MED ORDER — BETAMETHASONE SOD PHOS & ACET 6 (3-3) MG/ML IJ SUSP
12.0000 mg | INTRAMUSCULAR | Status: AC
  Administered 2019-06-22 – 2019-06-23 (×2): 12 mg via INTRAMUSCULAR
  Filled 2019-06-22 (×2): qty 5

## 2019-06-22 MED ORDER — PHENYLEPHRINE 40 MCG/ML (10ML) SYRINGE FOR IV PUSH (FOR BLOOD PRESSURE SUPPORT): 80.0000 ug | PREFILLED_SYRINGE | INTRAVENOUS | Status: DC | PRN

## 2019-06-22 MED ORDER — DIPHENHYDRAMINE HCL 50 MG/ML IJ SOLN: 12.5000 mg | INTRAMUSCULAR | Status: DC | PRN

## 2019-06-22 MED ORDER — FENTANYL CITRATE (PF) 100 MCG/2ML IJ SOLN: 100.0000 ug | INTRAMUSCULAR | Status: DC | PRN

## 2019-06-22 MED ORDER — EPHEDRINE 5 MG/ML INJ: 10.0000 mg | INTRAVENOUS | Status: DC | PRN

## 2019-06-22 MED ORDER — OXYTOCIN 40 UNITS IN NORMAL SALINE INFUSION - SIMPLE MED
1.0000 m[IU]/min | INTRAVENOUS | Status: DC
  Administered 2019-06-22 – 2019-06-23 (×2): 2 m[IU]/min via INTRAVENOUS
  Filled 2019-06-22 (×2): qty 1000

## 2019-06-22 MED ORDER — OXYTOCIN 40 UNITS IN NORMAL SALINE INFUSION - SIMPLE MED: 2.5000 [IU]/h | INTRAVENOUS | Status: DC

## 2019-06-22 MED ORDER — METFORMIN HCL 500 MG PO TABS: 1000.0000 mg | ORAL_TABLET | Freq: Two times a day (BID) | ORAL | 1 refills | Status: DC

## 2019-06-22 MED ORDER — ONDANSETRON HCL 4 MG/2ML IJ SOLN: 4.0000 mg | Freq: Four times a day (QID) | INTRAMUSCULAR | Status: DC | PRN

## 2019-06-22 MED ORDER — FENTANYL-BUPIVACAINE-NACL 0.5-0.125-0.9 MG/250ML-% EP SOLN: 12.0000 mL/h | EPIDURAL | Status: DC | PRN

## 2019-06-22 MED ORDER — SOD CITRATE-CITRIC ACID 500-334 MG/5ML PO SOLN
30.0000 mL | ORAL | Status: DC | PRN
  Administered 2019-06-24: 30 mL via ORAL
  Filled 2019-06-22: qty 30

## 2019-06-22 MED ORDER — LACTATED RINGERS IV SOLN: 500.0000 mL | INTRAVENOUS | Status: DC | PRN

## 2019-06-22 MED ORDER — SODIUM CHLORIDE 0.9 % IV SOLN
5.0000 10*6.[IU] | Freq: Once | INTRAVENOUS | Status: AC
  Administered 2019-06-22: 5 10*6.[IU] via INTRAVENOUS
  Filled 2019-06-22: qty 5

## 2019-06-22 MED ORDER — OXYTOCIN BOLUS FROM INFUSION: 500.0000 mL | Freq: Once | INTRAVENOUS | Status: DC

## 2019-06-22 MED ORDER — LACTATED RINGERS IV SOLN: INTRAVENOUS | Status: DC

## 2019-06-22 NOTE — MAU Note (Signed)
Covid swab collected. PT to labor and delivery

## 2019-06-22 NOTE — Progress Notes (Signed)
LABOR PROGRESS NOTE  Leah Gray is a 24 y.o. G3P0020 at [redacted]w[redacted]d  admitted for PPROM, mono-di twins (vtx/vtx), GDMA2.  Subjective: Comfortable Not feeling contractions at all  Objective: BP 117/71   Pulse 87   Temp 98.3 F (36.8 C) (Oral)   Resp 17   Ht 5\' 1"  (1.549 m)   Wt 83 kg   LMP 10/18/2018 (Approximate)   BMI 34.58 kg/m  or  Vitals:   06/22/19 1900 06/22/19 1901 06/22/19 1933 06/22/19 2103  BP:  109/63  117/71  Pulse:  84  87  Resp: 16  17   Temp:   98.3 F (36.8 C)   TempSrc:   Oral   Weight:      Height:         Dilation: 3.5 Effacement (%): 90 Station: -1 Presentation: Vertex Exam by:: Dr. [email or web address] FHT A: baseline rate 125, moderate varibility, +acel, -decel FHT B: baseline rate 125, moderate varibility, +acel, -decel Toco: q2-3 min per electronic monitor, not palpating well  Labs: Lab Results  Component Value Date   WBC 8.4 06/22/2019   HGB 12.4 06/22/2019   HCT 37.6 06/22/2019   MCV 94.9 06/22/2019   PLT 356 06/22/2019    Patient Active Problem List   Diagnosis Date Noted  . Preterm premature rupture of membranes (PPROM) with unknown onset of labor 06/22/2019  . Labor and delivery, indication for care 06/22/2019  . GDM (gestational diabetes mellitus) 04/26/2019  . Rubella non-immune status, antepartum 02/10/2019  . Supervision of high risk pregnancy, antepartum 02/08/2019  . Monochorionic diamniotic twin pregnancy, antepartum 02/08/2019    Assessment / Plan: 24 y.o. G3P0020 at [redacted]w[redacted]d here for PPROM, mono-di twins (vtx/vtx), GDMA2.  Labor: s/p SROM and started on pitocin@1345  with minimal change, currently at 16u. Cervix remains favorable, cont pitocin uptitration.  Fetal Wellbeing:  Cat I for both twin A and B Pain Control:  IV pain meds PRN, plans on epidural GBS: unknown, on penicillin with culture pending Anticipated MOD:  SVD x2  Mono-di twin gestation: Last [redacted]w[redacted]d yesterday with no growth restriction seen in either twin, twin A 34% and  twin B 26%, BPP 8/8 for both twins. Plan for no delayed cord clamping at delivery given monochorionic.  PPROM: s/p BMZ x1 at 1429, ordered for second dose. On penicillin for preterm w GBS unknown.   GDMA2: on metformin, CBG acceptable since admission 131>113>102. CBG q4h latent labor, q2h active labor   [place], MD/MP[facility] OB Fellow  06/22/2019, 9:27 PM

## 2019-06-22 NOTE — Progress Notes (Signed)
Labor Progress Note Leah Gray is a 24 y.o. G3P0020 at [redacted]w[redacted]d presented for IOL for PPROM.  S: Overall comfortable, not feeling ctx.   O:  BP 100/64   Pulse 76   Temp 98.5 F (36.9 C) (Oral)   Resp 16   Ht 5\' 1"  (1.549 m)   Wt 83 kg   LMP 10/18/2018 (Approximate)   BMI 34.58 kg/m  EFM: Twin A and B: 150, moderate variability, pos accels, no decels, reactive TOCO: q2-54m  CVE: Dilation: 3 Effacement (%): 90 Station: -1 Presentation: Vertex Exam by:: Dr. [email or web address]   A&P: 24 y.o. 22 [redacted]w[redacted]d here for IOL for PPROM. #Labor: Vertex, vertex. PPROM on 2/23 at 1700. Cont Pit titration. Anticipate SVD x2. #Pain: per patient request #FWB: Cat I x2 #GBS pending; cont PCN #GDMA2: last glucose 113  3/23, MD 5:57 PM

## 2019-06-22 NOTE — Addendum Note (Signed)
Addended by: Jaynie Collins A on: 06/22/2019 10:15 AM   Modules accepted: Orders

## 2019-06-22 NOTE — Progress Notes (Addendum)
   PRENATAL VISIT NOTE  Subjective:  Leah Gray is a 23 y.o. G3P0020 at [redacted]w[redacted]d being seen today for ongoing prenatal care.  She is currently monitored for the following issues for this high-risk pregnancy and has Supervision of high risk pregnancy, antepartum; Monochorionic diamniotic twin pregnancy, antepartum; Rubella non-immune status, antepartum; GDM (gestational diabetes mellitus); and Preterm premature rupture of membranes (PPROM) with unknown onset of labor on their problem list.  Patient reports leaking of clear fluid since 5pm last night.  Contractions: Not present. Vag. Bleeding: None.  Movement: Present. Denies leaking of fluid.   The following portions of the patient's history were reviewed and updated as appropriate: allergies, current medications, past family history, past medical history, past social history, past surgical history and problem list.   Objective:   Vitals:   06/22/19 0925  BP: 119/75  Pulse: 91  Weight: 183 lb (83 kg)    Fetal Status: Fetal Heart Rate (bpm): 136, 142   Movement: Present  Presentation: Vertex  General:  Alert, oriented and cooperative. Patient is in no acute distress.  Skin: Skin is warm and dry. No rash noted.   Cardiovascular: Normal heart rate noted  Respiratory: Normal respiratory effort, no problems with respiration noted  Abdomen: Soft, gravid, appropriate for gestational age.  Pain/Pressure: Absent     Pelvic: Cervical exam performed Dilation: 2 Effacement (%): 90 Station: 0. Copious amount of clear fluid in vagina coming through cervix.  Extremities: Normal range of motion.  Edema: Trace  Mental Status: Normal mood and affect. Normal behavior. Normal judgment and thought content.   Assessment and Plan:  Pregnancy: G3P0020 at [redacted]w[redacted]d 1. Preterm premature rupture of membranes (PPROM) with unknown onset of labor Grossly ruptured, cultures collected. Sent to L&D at [facility], staff on call and RN in charge notified.  - Culture, beta strep  (group b only) - Cervicovaginal ancillary only( [place])  2. Gestational diabetes mellitus (GDM) in third trimester, gestational diabetes method of control unspecified Reviewed BS and was going to increase from 500 mg po qhs to 1000 mg bid but she is now ruptured.  This was discontinued. Noncompliant, not many blood sugars checked.  3. Supervision of high risk pregnancy, antepartum To L&D now.   Return for Postpartum check.  Future Appointments  Date Time Provider Department Center  06/28/2019 10:30 AM [facility]-MFC NURSE WH-MFC MFC-US  06/28/2019 10:30 AM WH-MFC [place] 5 WH-MFCUS MFC-US  06/30/2019 11:15 AM [place] Bing, MD [facility] WOC  07/06/2019  9:15 AM Adam Phenix, MD [facility] WOC  07/13/2019 10:15 AM Leonardo Bing, MD [facility]    Jaynie Collins, MD

## 2019-06-22 NOTE — H&P (Signed)
OBSTETRIC ADMISSION HISTORY AND PHYSICAL  Leah Gray is a 24 y.o. female G16P0020 with IUP at [redacted]w[redacted]d by 13 wk [place] presenting for IOL for PPROM. Patient went to clinic this morning and reporting LOF since 1700 on 2/23 and found to be grossly ruptured. She reports +FMs, no VB, no blurry vision, headaches or peripheral edema, and RUQ pain.  She plans on breast feeding. She is undecided for birth control. She received her prenatal care at [facility]   Dating: By 13 wk [place] --->  Estimated Date of Delivery: 07/25/19  Sono: 2/23  @[redacted]w[redacted]d , CWD, normal anatomy, cephalic-cephalic presentation, posterior fundal placental lie, Twin A: 2493g, 34% EFW Twin B: 2417g, 26% EFW  Prenatal History/Complications: Mono-di twins GDMA2 on Metformin PPROM  Past Medical History: Past Medical History:  Diagnosis Date  . Closed bimalleolar fracture of left ankle 04/14/2012  . Gestational diabetes     Past Surgical History: Past Surgical History:  Procedure Laterality Date  . ANKLE FRACTURE SURGERY  2014   bilateral   . ANKLE SURGERY    . LAPAROSCOPY Right 09/05/2017   Procedure: LAPAROSCOPY OPERATIVE WITH RIGHT SALPINGECTOMY;  Surgeon: Donnamae Jude, MD;  Location: [facility];  Service: Gynecology;  Laterality: Right;    Obstetrical History: OB History    Gravida  3   Para      Term      Preterm      AB  2   Living        SAB  0   TAB      Ectopic  2   Multiple      Live Births              Social History: Social History   Socioeconomic History  . Marital status: Single    Spouse name: Not on file  . Number of children: Not on file  . Years of education: Not on file  . Highest education level: Not on file  Occupational History  . Not on file  Tobacco Use  . Smoking status: Former Smoker    Types: Cigarettes    Quit date: 10/27/2015    Years since quitting: 3.6  . Smokeless tobacco: Never Used  Substance and Sexual Activity  . Alcohol use: Not Currently    Comment: social - had  drank twice before knew pregnant  . Drug use: Never  . Sexual activity: Yes    Birth control/protection: None  Other Topics Concern  . Not on file  Social History Narrative  . Not on file   Social Determinants of Health   Financial Resource Strain:   . Difficulty of Paying Living Expenses: Not on file  Food Insecurity: No Food Insecurity  . Worried About Charity fundraiser in the Last Year: Never true  . Ran Out of Food in the Last Year: Never true  Transportation Needs: No Transportation Needs  . Lack of Transportation (Medical): No  . Lack of Transportation (Non-Medical): No  Physical Activity:   . Days of Exercise per Week: Not on file  . Minutes of Exercise per Session: Not on file  Stress:   . Feeling of Stress : Not on file  Social Connections:   . Frequency of Communication with Friends and Family: Not on file  . Frequency of Social Gatherings with Friends and Family: Not on file  . Attends Religious Services: Not on file  . Active Member of Clubs or Organizations: Not on file  . Attends Club or  Organization Meetings: Not on file  . Marital Status: Not on file    Family History: Family History  Problem Relation Age of Onset  . Asthma Mother   . Cancer Mother   . Breast cancer Mother   . Miscarriages / Stillbirths Maternal Aunt   . COPD Maternal Grandmother   . Anxiety disorder Maternal Grandfather   . Depression Maternal Grandfather   . Diabetes Maternal Grandfather     Allergies: No Known Allergies  Medications Prior to Admission  Medication Sig Dispense Refill Last Dose  . Accu-Chek FastClix Lancets MISC 1 Device by Percutaneous route 4 (four) times daily. 100 each 12   . ASPIRIN 81 PO Take by mouth.     . Blood Pressure Monitoring DEVI 1 Device by Does not apply route once a week. 1 Device 0   . folic acid (FOLVITE) 1 MG tablet Take 1 tablet (1 mg total) by mouth daily. 30 tablet 9   . glucose blood (ACCU-CHEK GUIDE) test strip Use as instructed QID  100 each 12   . Prenatal Vit-Fe Fumarate-FA (PRENATAL VITAMINS PO) Take 1 tablet by mouth daily.        Review of Systems   All systems reviewed and negative except as stated in HPI  Blood pressure 116/75, pulse 88, temperature 98.5 F (36.9 C), temperature source Oral, resp. rate 20, height 5\' 1"  (1.549 m), weight 83 kg, last menstrual period 10/18/2018. General appearance: alert, cooperative and appears stated age Lungs: normal effort Heart: regular rate  Abdomen: soft, non-tender; bowel sounds normal Pelvic: gravid uterus Extremities: Homans sign is negative, no sign of DVT Presentation: cephalic cephalic by 10/20/2018 2/23 Fetal monitoring: Twin A: 150, moderate variability, pos accels, no decels, reactive Twin B: 155, moderate variability, pos accels, no decels, reactive Uterine activity: None Dilation: 2 Effacement (%): 90 Station: -1 Exam by:: [email or web address] RN   Prenatal labs: ABO, Rh: --/--/O POS (02/24 1311) Antibody: NEG (02/24 1311) Rubella: <0.90 (10/14 1114) RPR: Non Reactive (12/28 0856)  HBsAg: Negative (10/14 1114)  HIV: Non Reactive (12/28 0856)  GBS:   pending  2 hr Glucola abnormal Genetic screening  Low risk  Anatomy 06-11-1980 mono-di twins  Prenatal Transfer Tool  Maternal Diabetes: Yes:  Diabetes Type:  Diet controlled Genetic Screening: Normal Maternal Ultrasounds/Referrals: Normal Fetal Ultrasounds or other Referrals:  Fetal echo Maternal Substance Abuse:  No Significant Maternal Medications:  None Significant Maternal Lab Results: Other: GBS unknown  Results for orders placed or performed during the hospital encounter of 06/22/19 (from the past 24 hour(s))  Respiratory Panel by RT PCR (Flu A&B, Covid) - Nasopharyngeal Swab   Collection Time: 06/22/19 12:06 PM   Specimen: Nasopharyngeal Swab  Result Value Ref Range   SARS Coronavirus 2 by RT PCR NEGATIVE NEGATIVE   Influenza A by PCR NEGATIVE NEGATIVE   Influenza B by PCR NEGATIVE NEGATIVE  Glucose,  capillary   Collection Time: 06/22/19  1:07 PM  Result Value Ref Range   Glucose-Capillary 131 (H) 70 - 99 mg/dL  CBC   Collection Time: 06/22/19  1:11 PM  Result Value Ref Range   WBC 8.4 4.0 - 10.5 K/uL   RBC 3.96 3.87 - 5.11 MIL/uL   Hemoglobin 12.4 12.0 - 15.0 g/dL   HCT 06/24/19 42.3 - 53.6 %   MCV 94.9 80.0 - 100.0 fL   MCH 31.3 26.0 - 34.0 pg   MCHC 33.0 30.0 - 36.0 g/dL   RDW 14.4 31.5 - 40.0 %  Platelets 356 150 - 400 K/uL   nRBC 0.0 0.0 - 0.2 %  Type and screen [facility]   Collection Time: 06/22/19  1:11 PM  Result Value Ref Range   ABO/RH(D) O POS    Antibody Screen NEG    Sample Expiration      06/25/2019,2359 Performed at [facility], [address], [place], [place] [address]     Patient Active Problem List   Diagnosis Date Noted  . Preterm premature rupture of membranes (PPROM) with unknown onset of labor 06/22/2019  . Labor and delivery, indication for care 06/22/2019  . GDM (gestational diabetes mellitus) 04/26/2019  . Rubella non-immune status, antepartum 02/10/2019  . Supervision of high risk pregnancy, antepartum 02/08/2019  . Monochorionic diamniotic twin pregnancy, antepartum 02/08/2019    Assessment/Plan:  Elaine Roanhorse is a 24 y.o. G3P0020 at [redacted]w[redacted]d here for for [facility] 2/23 at 1700.  #Labor: Vertex-vertex by [place] 2/23 and Twin A vertex by exam on admission. Pitocin initiated due to 90% effacement. Foley bulb if no progress at next check. Anticipate SVDx2. Will give BMZ.  #Pain: Per patient request #FWB: Cat I x2; EFW: 23g Twin A, 2400g Twin B #ID:  GBS pending; due to preterm status will treat with PCN #MOF: Breast #MOC: Undecided #Circ:  Outpatient  #GDMA2: On Metformin PTA. CBG's q4h latent, q2h active labor. Low threshold for PRN insulin or EndoTool if elevated. BMZ will further complicate glucose control.   Jerilynn Birkenhead, MD [facility] Family Medicine Fellow, [facility] for [facility], [facility] 06/22/2019, 2:28 PM

## 2019-06-22 NOTE — Progress Notes (Signed)
     No BG values for week of Feb 21.  Fleet Contras RN 06/22/19

## 2019-06-23 DIAGNOSIS — O30033 Twin pregnancy, monochorionic/diamniotic, third trimester: Secondary | ICD-10-CM | POA: Diagnosis not present

## 2019-06-23 LAB — GLUCOSE, CAPILLARY
Glucose-Capillary: 96 mg/dL (ref 70–99)
Glucose-Capillary: 94 mg/dL (ref 70–99)
Glucose-Capillary: 116 mg/dL — ABNORMAL HIGH (ref 70–99)
Glucose-Capillary: 124 mg/dL — ABNORMAL HIGH (ref 70–99)
Glucose-Capillary: 214 mg/dL — ABNORMAL HIGH (ref 70–99)
Glucose-Capillary: 163 mg/dL — ABNORMAL HIGH (ref 70–99)
Glucose-Capillary: 146 mg/dL — ABNORMAL HIGH (ref 70–99)
Glucose-Capillary: 144 mg/dL — ABNORMAL HIGH (ref 70–99)
Glucose-Capillary: 157 mg/dL — ABNORMAL HIGH (ref 70–99)

## 2019-06-23 LAB — CREATININE, SERUM
Creatinine, Ser: 0.49 mg/dL (ref 0.44–1.00)
GFR calc Af Amer: >60 mL/min (ref >60)
GFR calc non Af Amer: >60 mL/min (ref >60)

## 2019-06-23 LAB — RPR: RPR Ser Ql: NONREACTIVE

## 2019-06-23 MED ORDER — MISOPROSTOL 50MCG HALF TABLET
50.0000 ug | ORAL_TABLET | Freq: Once | ORAL | Status: AC
  Administered 2019-06-23: 50 ug via BUCCAL
  Filled 2019-06-23: qty 1

## 2019-06-23 MED ORDER — SODIUM CHLORIDE 0.9 % IV SOLN: INTRAVENOUS | Status: DC

## 2019-06-23 MED ORDER — DEXTROSE-NACL 5-0.45 % IV SOLN: INTRAVENOUS | Status: DC

## 2019-06-23 MED ORDER — INSULIN REGULAR(HUMAN) IN NACL 100-0.9 UT/100ML-% IV SOLN
INTRAVENOUS | Status: DC
  Administered 2019-06-23: 0.3 [IU]/h via INTRAVENOUS
  Filled 2019-06-23: qty 100

## 2019-06-23 MED ORDER — DEXTROSE 50 % IV SOLN: 0.0000 mL | INTRAVENOUS | Status: DC | PRN

## 2019-06-23 NOTE — Progress Notes (Signed)
Labor Progress Note Leah Gray is a 24 y.o. G3P0020 at [redacted]w[redacted]d presented for IOL for PROM.  S:  Comfortable, not feeling ctx.   O:  BP 120/73   Pulse 95   Temp 98.2 F (36.8 C) (Oral)   Resp 15   Ht 5\' 1"  (1.549 m)   Wt 83 kg   LMP 10/18/2018 (Approximate)   BMI 34.58 kg/m  EFM-A: baseline 145  bpm/ mod variability/ + accels/ no decels  EFM-B: baseline 135 bpm/ mod variability/ + accels/ no decels  Toco/IUPC: irregular SVE: deferred Pitocin: 10 mu/min CBG: 116  A/P: 24 y.o. G3P0020 [redacted]w[redacted]d  1. Labor: latent 2. FWB: Cat I x2 3. Pain: analgesia prn 4. GDM- stable  Continue Pitocin titration. Anticipate labor progress and SVD.  [redacted]w[redacted]d, CNM 12:48 PM

## 2019-06-23 NOTE — Progress Notes (Signed)
LABOR PROGRESS NOTE  Sanoe Hazan is a 24 y.o. G3P0020 at [redacted]w[redacted]d  admitted for PPROM, mono-di twins (vtx/vtx), GDMA2.  Subjective: Comfortable, not feeling ctx. Discussed plan and situation in detail.   Objective: BP 126/74   Pulse 85   Temp 98.3 F (36.8 C) (Oral)   Resp 17   Ht 5\' 1"  (1.549 m)   Wt 83 kg   LMP 10/18/2018 (Approximate)   BMI 34.58 kg/m  or  Vitals:   06/23/19 1831 06/23/19 1918 06/23/19 1932 06/23/19 2001  BP: 116/68  (!) 126/102 126/74  Pulse: 82  82 85  Resp: 17 17    Temp:  98.3 F (36.8 C)    TempSrc:  Oral    Weight:      Height:         Dilation: 4 Effacement (%): 90 Cervical Position: Middle Station: -1 Presentation: Vertex Exam by:: [email or web address] RN FHT A: baseline rate 135, moderate varibility, +acel, -decel FHT B: baseline rate 155, moderate varibility, +acel, -decel Toco: None  Labs: Lab Results  Component Value Date   WBC 8.4 06/22/2019   HGB 12.4 06/22/2019   HCT 37.6 06/22/2019   MCV 94.9 06/22/2019   PLT 356 06/22/2019    Patient Active Problem List   Diagnosis Date Noted  . Preterm premature rupture of membranes (PPROM) with unknown onset of labor 06/22/2019  . Labor and delivery, indication for care 06/22/2019  . GDM (gestational diabetes mellitus) 04/26/2019  . Rubella non-immune status, antepartum 02/10/2019  . Supervision of high risk pregnancy, antepartum 02/08/2019  . Monochorionic diamniotic twin pregnancy, antepartum 02/08/2019    Assessment / Plan: 24 y.o. G3P0020 at 101w3d here for PPROM, mono-di twins (vtx/vtx), GDMA2.  Labor: s/p PPROM on 2/23 at 1700. Patient was started on Pitocin and titrated to max and then received 4 hour Pit break and titrated to max for second time without progress. Will try 50 mcg buccal Cytotec and recheck in 4 hours. Also encouraged nipple stimulation and will have breast pump at bedside; patient amenable to this. Discussed overall situation, lack of progress, risk of infection  and options moving forward. If no change in 4 hours after Cytotec, will likely offer CS. Patient to discuss with FOB and family in the meantime and will re-discuss with patient at 0100 if no progress. Has been NPO since 1800 and will remain NPO currently. Dr. 3/23 aware of plan.  Fetal Wellbeing:  Cat I for both twin A and B Pain Control:  IV pain meds PRN, plans on epidural GBS: unknown, on penicillin with culture pending Anticipated MOD:  Guarded for SVD due to lack of progress over last 36 hours.   Mono-di twin gestation: Last Jolayne Panther 2/23 with no growth restriction seen in either twin, twin A 34% and twin B 26%, BPP 8/8 for both twins. Plan for no delayed cord clamping at delivery given monochorionic.  PPROM: s/p BMZ x2. On penicillin for preterm w GBS unknown; cx pending.    GDMA2: Last glucose 163. Has received BMZ x2; likely elevating sugars. Will start EndoTool.    3/23, MD [facility] Family Medicine Fellow, [facility] for [facility], [facility] 06/23/2019, 8:48 PM

## 2019-06-23 NOTE — Progress Notes (Signed)
LABOR PROGRESS NOTE  Leah Gray is a 24 y.o. G3P0020 at [redacted]w[redacted]d  admitted for PPROM, mono-di twins (vtx/vtx), GDMA2.  Subjective: sleeping  Objective: BP 105/60   Pulse 70   Temp 98.2 F (36.8 C) (Oral)   Resp 17   Ht 5\' 1"  (1.549 m)   Wt 83 kg   LMP 10/18/2018 (Approximate)   BMI 34.58 kg/m  or  Vitals:   06/23/19 0100 06/23/19 0130 06/23/19 0132 06/23/19 0200  BP: 118/69 120/74  105/60  Pulse: 68 78  70  Resp:      Temp:   98.2 F (36.8 C)   TempSrc:   Oral   Weight:      Height:         Dilation: 4 Effacement (%): 90 Cervical Position: Middle Station: -1 Presentation: Vertex Exam by:: [email or web address], RN FHT A: baseline rate 125, moderate varibility, +acel, -decel FHT B: baseline rate 125, moderate varibility, +acel, -decel Toco: q2-3 min per electronic monitor, not palpating well  Labs: Lab Results  Component Value Date   WBC 8.4 06/22/2019   HGB 12.4 06/22/2019   HCT 37.6 06/22/2019   MCV 94.9 06/22/2019   PLT 356 06/22/2019    Patient Active Problem List   Diagnosis Date Noted  . Preterm premature rupture of membranes (PPROM) with unknown onset of labor 06/22/2019  . Labor and delivery, indication for care 06/22/2019  . GDM (gestational diabetes mellitus) 04/26/2019  . Rubella non-immune status, antepartum 02/10/2019  . Supervision of high risk pregnancy, antepartum 02/08/2019  . Monochorionic diamniotic twin pregnancy, antepartum 02/08/2019    Assessment / Plan: 24 y.o. G3P0020 at [redacted]w[redacted]d here for PPROM, mono-di twins (vtx/vtx), GDMA2.  Labor: s/p SROM and started on pitocin@1345  with minimal change, currently at 30u. Cervix remains favorable, cont pitocin uptitration. If reaches 40u without significant contractions will do 4hr pit break.  Fetal Wellbeing:  Cat I for both twin A and B Pain Control:  IV pain meds PRN, plans on epidural GBS: unknown, on penicillin with culture pending Anticipated MOD:  SVD x2  Mono-di twin gestation: Last [redacted]w[redacted]d  yesterday with no growth restriction seen in either twin, twin A 34% and twin B 26%, BPP 8/8 for both twins. Plan for no delayed cord clamping at delivery given monochorionic.  PPROM: s/p BMZ x1 at 1429, ordered for second dose. On penicillin for preterm w GBS unknown.   GDMA2: on metformin, CBG acceptable since admission 131>113>102>108>96. CBG q4h latent labor, q2h active labor   [place], MD/MP[facility] OB Fellow  06/23/2019, 3:17 AM

## 2019-06-23 NOTE — Progress Notes (Addendum)
Labor Progress Note Eevee Borbon is a 24 y.o. G3P0020 at [redacted]w[redacted]d presented for PPROM, mono-di twins (vtx/vtx), GDMA2. S:  No complaints, not yet feeling contractions.  O:  BP 107/63   Pulse 79   Temp 97.7 F (36.5 C) (Oral)   Resp 16   Ht 5\' 1"  (1.549 m)   Wt 83 kg   LMP 10/18/2018 (Approximate)   BMI 34.58 kg/m  EFM: 130, 135/moderate, moderate/acels present in both  CVE: Dilation: 4 Effacement (%): 90 Cervical Position: Middle Station: -1 Presentation: Vertex Exam by:: [email or web address], RN   A&P: 24 y.o. 22 [redacted]w[redacted]d with PPROM, mono-di twins (vtx/vtx), GDMA2. #Labor: Latient, Pit break, restart around 10am #Pain:  IV pain meds PRN, epidural when appropriate #FWB: Cat 1/Cat1 #GBS unknown, on PCN with cx pending  Mono-di twin gestation: Plan for no delayed cord clamping at delivery given monochorionic.  PPROM: s/p BMZ x 1 at 1429 yesterday with second dose ordered. On PCN for preterm with GBS unknown  GDMA2: on metformin. Most recent cbgs: 108, 96, 94. CBG q4hr during latent labor, q2hr during active.  [redacted]w[redacted]d, DO 9:54 AM

## 2019-06-23 NOTE — Progress Notes (Signed)
Labor Progress Note Adali Pennings is a 24 y.o. G3P0020 at [redacted]w[redacted]d presented for IOL for PROM. S:  No complaints. Not feeling contractions. O:  BP 102/62   Pulse 75   Temp 97.9 F (36.6 C) (Oral)   Resp 16   Ht 5\' 1"  (1.549 m)   Wt 83 kg   LMP 10/18/2018 (Approximate)   BMI 34.58 kg/m  EFM: 135, 145/moderate, moderate/acels present in both  CVE: Dilation: 4 Effacement (%): 90 Cervical Position: Middle Station: -1 Presentation: Vertex Exam by:: [email or web address] [place] RN/Jennifer Irving Burton   A&P: 24 y.o. 22 [redacted]w[redacted]d for IOL for PROM, mono-di twins (vtx/vtx). #Labor: Latent, continuing on pitocin #Pain: IV pain meds PRN. Epidural when indicated #FWB: Cat 1/Cat 1 #GBS unknown, on PCN with cx pending   [redacted]w[redacted]d, DO 4:34 PM

## 2019-06-24 ENCOUNTER — Inpatient Hospital Stay (HOSPITAL_COMMUNITY): Payer: Medicaid Other | Admitting: Certified Registered"

## 2019-06-24 ENCOUNTER — Encounter (HOSPITAL_COMMUNITY)
Admission: EM | Disposition: A | Discharge status: Home / Self Care | Payer: Self-pay | Source: Home / Self Care | Attending: Obstetrics and Gynecology

## 2019-06-24 ENCOUNTER — Encounter (HOSPITAL_COMMUNITY): Payer: Self-pay | Admitting: Obstetrics and Gynecology

## 2019-06-24 DIAGNOSIS — Z3A35 35 weeks gestation of pregnancy: Secondary | ICD-10-CM

## 2019-06-24 DIAGNOSIS — O24420 Gestational diabetes mellitus in childbirth, diet controlled: Secondary | ICD-10-CM

## 2019-06-24 DIAGNOSIS — O30033 Twin pregnancy, monochorionic/diamniotic, third trimester: Secondary | ICD-10-CM

## 2019-06-24 LAB — CERVICOVAGINAL ANCILLARY ONLY
Bacterial Vaginitis (gardnerella): POSITIVE — AB
Candida Glabrata: NEGATIVE
Candida Vaginitis: NEGATIVE
Chlamydia: NEGATIVE
Comment: NEGATIVE
Comment: NEGATIVE
Comment: NEGATIVE
Comment: NEGATIVE
Comment: NEGATIVE
Comment: NORMAL
Neisseria Gonorrhea: NEGATIVE
Trichomonas: NEGATIVE

## 2019-06-24 LAB — CREATININE, SERUM
Creatinine, Ser: 0.58 mg/dL (ref 0.44–1.00)
GFR calc Af Amer: >60 mL/min (ref >60)
GFR calc non Af Amer: >60 mL/min (ref >60)

## 2019-06-24 LAB — CBC
HCT: 29.4 % — ABNORMAL LOW (ref 36.0–46.0)
Hemoglobin: 9.6 g/dL — ABNORMAL LOW (ref 12.0–15.0)
MCH: 31.6 pg (ref 26.0–34.0)
MCHC: 32.7 g/dL (ref 30.0–36.0)
MCV: 96.7 fL (ref 80.0–100.0)
Platelets: 310 10*3/uL (ref 150–400)
RBC: 3.04 MIL/uL — ABNORMAL LOW (ref 3.87–5.11)
RDW: 13.4 % (ref 11.5–15.5)
WBC: 14 10*3/uL — ABNORMAL HIGH (ref 4.0–10.5)
nRBC: 0 % (ref 0.0–0.2)

## 2019-06-24 LAB — GLUCOSE, CAPILLARY
Glucose-Capillary: 107 mg/dL — ABNORMAL HIGH (ref 70–99)
Glucose-Capillary: 124 mg/dL — ABNORMAL HIGH (ref 70–99)
Glucose-Capillary: 126 mg/dL — ABNORMAL HIGH (ref 70–99)
Glucose-Capillary: 142 mg/dL — ABNORMAL HIGH (ref 70–99)

## 2019-06-24 SURGERY — Surgical Case
Anesthesia: Regional | Site: Abdomen | Wound class: Clean Contaminated

## 2019-06-24 MED ORDER — FENTANYL CITRATE (PF) 100 MCG/2ML IJ SOLN
INTRAMUSCULAR | Status: DC | PRN
  Administered 2019-06-24: 15 ug via INTRATHECAL

## 2019-06-24 MED ORDER — KETOROLAC TROMETHAMINE 30 MG/ML IJ SOLN
30.0000 mg | Freq: Four times a day (QID) | INTRAMUSCULAR | Status: AC
  Administered 2019-06-24 (×3): 30 mg via INTRAVENOUS
  Filled 2019-06-24 (×3): qty 1

## 2019-06-24 MED ORDER — SODIUM CHLORIDE 0.9 % IV SOLN
INTRAVENOUS | Status: AC
  Filled 2019-06-24: qty 500

## 2019-06-24 MED ORDER — SIMETHICONE 80 MG PO CHEW: 80.0000 mg | CHEWABLE_TABLET | ORAL | Status: DC | PRN

## 2019-06-24 MED ORDER — LACTATED RINGERS IV SOLN: INTRAVENOUS | Status: DC

## 2019-06-24 MED ORDER — PHENYLEPHRINE HCL-NACL 20-0.9 MG/250ML-% IV SOLN
INTRAVENOUS | Status: DC | PRN
  Administered 2019-06-24: 40 ug/min via INTRAVENOUS
  Administered 2019-06-24: 60 ug/min via INTRAVENOUS

## 2019-06-24 MED ORDER — SIMETHICONE 80 MG PO CHEW
80.0000 mg | CHEWABLE_TABLET | Freq: Three times a day (TID) | ORAL | Status: DC
  Administered 2019-06-24 – 2019-06-26 (×7): 80 mg via ORAL
  Filled 2019-06-24 (×7): qty 1

## 2019-06-24 MED ORDER — COCONUT OIL OIL
1.0000 "application " | TOPICAL_OIL | Status: DC | PRN
  Administered 2019-06-26: 1 via TOPICAL

## 2019-06-24 MED ORDER — DIPHENHYDRAMINE HCL 25 MG PO CAPS: 25.0000 mg | ORAL_CAPSULE | ORAL | Status: DC | PRN

## 2019-06-24 MED ORDER — OXYCODONE HCL 5 MG/5ML PO SOLN: 5.0000 mg | Freq: Once | ORAL | Status: DC | PRN

## 2019-06-24 MED ORDER — PRENATAL MULTIVITAMIN CH
1.0000 | ORAL_TABLET | Freq: Every day | ORAL | Status: DC
  Administered 2019-06-24 – 2019-06-25 (×2): 1 via ORAL
  Filled 2019-06-24 (×2): qty 1

## 2019-06-24 MED ORDER — INSULIN GLARGINE 100 UNIT/ML ~~LOC~~ SOLN
5.0000 [IU] | Freq: Once | SUBCUTANEOUS | Status: AC
  Administered 2019-06-24: 5 [IU] via SUBCUTANEOUS
  Filled 2019-06-24: qty 0.05

## 2019-06-24 MED ORDER — KETOROLAC TROMETHAMINE 30 MG/ML IJ SOLN
30.0000 mg | Freq: Once | INTRAMUSCULAR | Status: AC | PRN
  Administered 2019-06-24: 30 mg via INTRAVENOUS

## 2019-06-24 MED ORDER — SIMETHICONE 80 MG PO CHEW
80.0000 mg | CHEWABLE_TABLET | ORAL | Status: DC
  Administered 2019-06-24 – 2019-06-25 (×2): 80 mg via ORAL
  Filled 2019-06-24 (×2): qty 1

## 2019-06-24 MED ORDER — PHENYLEPHRINE HCL-NACL 20-0.9 MG/250ML-% IV SOLN
INTRAVENOUS | Status: AC
  Filled 2019-06-24: qty 250

## 2019-06-24 MED ORDER — MORPHINE SULFATE (PF) 0.5 MG/ML IJ SOLN
INTRAMUSCULAR | Status: DC | PRN
  Administered 2019-06-24: 150 ug via INTRATHECAL

## 2019-06-24 MED ORDER — LACTATED RINGERS IV SOLN: INTRAVENOUS | Status: DC | PRN

## 2019-06-24 MED ORDER — WITCH HAZEL-GLYCERIN EX PADS: 1.0000 "application " | MEDICATED_PAD | CUTANEOUS | Status: DC | PRN

## 2019-06-24 MED ORDER — HYDROMORPHONE HCL 1 MG/ML IJ SOLN: 0.2500 mg | INTRAMUSCULAR | Status: DC | PRN

## 2019-06-24 MED ORDER — OXYTOCIN 40 UNITS IN NORMAL SALINE INFUSION - SIMPLE MED
INTRAVENOUS | Status: DC | PRN
  Administered 2019-06-24: 40 [IU] via INTRAVENOUS

## 2019-06-24 MED ORDER — ENOXAPARIN SODIUM 40 MG/0.4ML ~~LOC~~ SOLN
40.0000 mg | SUBCUTANEOUS | Status: DC
  Administered 2019-06-25 – 2019-06-26 (×2): 40 mg via SUBCUTANEOUS
  Filled 2019-06-24 (×2): qty 0.4

## 2019-06-24 MED ORDER — PROMETHAZINE HCL 25 MG/ML IJ SOLN: 6.2500 mg | INTRAMUSCULAR | Status: DC | PRN

## 2019-06-24 MED ORDER — NALOXONE HCL 4 MG/10ML IJ SOLN
1.0000 ug/kg/h | INTRAVENOUS | Status: DC | PRN
  Filled 2019-06-24: qty 5

## 2019-06-24 MED ORDER — ACETAMINOPHEN 325 MG PO TABS
650.0000 mg | ORAL_TABLET | Freq: Four times a day (QID) | ORAL | Status: DC | PRN
  Administered 2019-06-25: 650 mg via ORAL
  Filled 2019-06-24: qty 2

## 2019-06-24 MED ORDER — IBUPROFEN 800 MG PO TABS
800.0000 mg | ORAL_TABLET | Freq: Four times a day (QID) | ORAL | Status: DC
  Administered 2019-06-25 – 2019-06-26 (×5): 800 mg via ORAL
  Filled 2019-06-24 (×5): qty 1

## 2019-06-24 MED ORDER — DIPHENHYDRAMINE HCL 25 MG PO CAPS: 25.0000 mg | ORAL_CAPSULE | Freq: Four times a day (QID) | ORAL | Status: DC | PRN

## 2019-06-24 MED ORDER — MEPERIDINE HCL 25 MG/ML IJ SOLN: 6.2500 mg | INTRAMUSCULAR | Status: DC | PRN

## 2019-06-24 MED ORDER — SODIUM CHLORIDE 0.9% FLUSH: 3.0000 mL | INTRAVENOUS | Status: DC | PRN

## 2019-06-24 MED ORDER — CEFAZOLIN SODIUM-DEXTROSE 2-4 GM/100ML-% IV SOLN
2.0000 g | INTRAVENOUS | Status: AC
  Administered 2019-06-24: 2 g via INTRAVENOUS

## 2019-06-24 MED ORDER — OXYCODONE HCL 5 MG PO TABS: 5.0000 mg | ORAL_TABLET | Freq: Once | ORAL | Status: DC | PRN

## 2019-06-24 MED ORDER — NALOXONE HCL 0.4 MG/ML IJ SOLN: 0.4000 mg | INTRAMUSCULAR | Status: DC | PRN

## 2019-06-24 MED ORDER — SODIUM CHLORIDE 0.9 % IV SOLN: INTRAVENOUS | Status: DC | PRN

## 2019-06-24 MED ORDER — OXYCODONE HCL 5 MG PO TABS: 5.0000 mg | ORAL_TABLET | ORAL | Status: DC | PRN

## 2019-06-24 MED ORDER — BUPIVACAINE IN DEXTROSE 0.75-8.25 % IT SOLN
INTRATHECAL | Status: DC | PRN
  Administered 2019-06-24: 1.6 mL via INTRATHECAL

## 2019-06-24 MED ORDER — ZOLPIDEM TARTRATE 5 MG PO TABS: 5.0000 mg | ORAL_TABLET | Freq: Every evening | ORAL | Status: DC | PRN

## 2019-06-24 MED ORDER — DIBUCAINE (PERIANAL) 1 % EX OINT: 1.0000 "application " | TOPICAL_OINTMENT | CUTANEOUS | Status: DC | PRN

## 2019-06-24 MED ORDER — DIPHENHYDRAMINE HCL 50 MG/ML IJ SOLN: 12.5000 mg | INTRAMUSCULAR | Status: DC | PRN

## 2019-06-24 MED ORDER — NALBUPHINE HCL 10 MG/ML IJ SOLN: 5.0000 mg | Freq: Once | INTRAMUSCULAR | Status: DC | PRN

## 2019-06-24 MED ORDER — SENNOSIDES-DOCUSATE SODIUM 8.6-50 MG PO TABS
2.0000 | ORAL_TABLET | ORAL | Status: DC
  Administered 2019-06-24 – 2019-06-25 (×2): 2 via ORAL
  Filled 2019-06-24 (×2): qty 2

## 2019-06-24 MED ORDER — MENTHOL 3 MG MT LOZG: 1.0000 | LOZENGE | OROMUCOSAL | Status: DC | PRN

## 2019-06-24 MED ORDER — ONDANSETRON HCL 4 MG/2ML IJ SOLN
INTRAMUSCULAR | Status: DC | PRN
  Administered 2019-06-24: 4 mg via INTRAVENOUS

## 2019-06-24 MED ORDER — NALBUPHINE HCL 10 MG/ML IJ SOLN: 5.0000 mg | INTRAMUSCULAR | Status: DC | PRN

## 2019-06-24 MED ORDER — KETOROLAC TROMETHAMINE 30 MG/ML IJ SOLN
INTRAMUSCULAR | Status: AC
  Filled 2019-06-24: qty 1

## 2019-06-24 MED ORDER — SCOPOLAMINE 1 MG/3DAYS TD PT72: 1.0000 | MEDICATED_PATCH | Freq: Once | TRANSDERMAL | Status: DC

## 2019-06-24 MED ORDER — FENTANYL CITRATE (PF) 100 MCG/2ML IJ SOLN
INTRAMUSCULAR | Status: AC
  Filled 2019-06-24: qty 2

## 2019-06-24 MED ORDER — ONDANSETRON HCL 4 MG/2ML IJ SOLN: 4.0000 mg | Freq: Three times a day (TID) | INTRAMUSCULAR | Status: DC | PRN

## 2019-06-24 MED ORDER — STERILE WATER FOR IRRIGATION IR SOLN
Status: DC | PRN
  Administered 2019-06-24: 1000 mL

## 2019-06-24 MED ORDER — MORPHINE SULFATE (PF) 0.5 MG/ML IJ SOLN
INTRAMUSCULAR | Status: AC
  Filled 2019-06-24: qty 10

## 2019-06-24 MED ORDER — OXYTOCIN 40 UNITS IN NORMAL SALINE INFUSION - SIMPLE MED: 2.5000 [IU]/h | INTRAVENOUS | Status: AC

## 2019-06-24 MED ORDER — METOCLOPRAMIDE HCL 5 MG/ML IJ SOLN
INTRAMUSCULAR | Status: DC | PRN
  Administered 2019-06-24: 10 mg via INTRAVENOUS

## 2019-06-24 MED ORDER — SODIUM CHLORIDE 0.9 % IR SOLN
Status: DC | PRN
  Administered 2019-06-24: 1000 mL

## 2019-06-24 MED ORDER — PHENYLEPHRINE HCL (PRESSORS) 10 MG/ML IV SOLN
INTRAVENOUS | Status: DC | PRN
  Administered 2019-06-24 (×3): 80 ug via INTRAVENOUS

## 2019-06-24 MED ORDER — TETANUS-DIPHTH-ACELL PERTUSSIS 5-2.5-18.5 LF-MCG/0.5 IM SUSP: 0.5000 mL | Freq: Once | INTRAMUSCULAR | Status: DC

## 2019-06-24 MED ORDER — FERROUS SULFATE 325 (65 FE) MG PO TABS
325.0000 mg | ORAL_TABLET | ORAL | Status: DC
  Administered 2019-06-24 – 2019-06-26 (×2): 325 mg via ORAL
  Filled 2019-06-24 (×3): qty 1

## 2019-06-24 MED ORDER — CEFAZOLIN SODIUM-DEXTROSE 2-4 GM/100ML-% IV SOLN
INTRAVENOUS | Status: AC
  Filled 2019-06-24: qty 100

## 2019-06-24 MED ORDER — SODIUM CHLORIDE 0.9 % IV SOLN
500.0000 mg | INTRAVENOUS | Status: AC
  Administered 2019-06-24: 02:00:00 500 mg via INTRAVENOUS

## 2019-06-24 SURGICAL SUPPLY — 26 items
BENZOIN TINCTURE PRP APPL 2/3 (GAUZE/BANDAGES/DRESSINGS) ×3 IMPLANT
CHLORAPREP W/TINT 26ML (MISCELLANEOUS) ×3 IMPLANT
CLAMP CORD UMBIL (MISCELLANEOUS) ×9 IMPLANT
CLOSURE WOUND 1/2 X4 (GAUZE/BANDAGES/DRESSINGS) ×1
DRSG OPSITE POSTOP 4X10 (GAUZE/BANDAGES/DRESSINGS) ×3 IMPLANT
ELECT REM PT RETURN 9FT ADLT (ELECTROSURGICAL) ×3
ELECTRODE REM PT RTRN 9FT ADLT (ELECTROSURGICAL) ×1 IMPLANT
GLOVE BIOGEL PI IND STRL 6.5 (GLOVE) ×1 IMPLANT
GLOVE BIOGEL PI IND STRL 7.0 (GLOVE) ×1 IMPLANT
GLOVE BIOGEL PI INDICATOR 6.5 (GLOVE) ×2
GLOVE BIOGEL PI INDICATOR 7.0 (GLOVE) ×2
GLOVE SURG SS PI 6.0 STRL IVOR (GLOVE) ×3 IMPLANT
GOWN STRL REUS W/TWL LRG LVL3 (GOWN DISPOSABLE) ×6 IMPLANT
NS IRRIG 1000ML POUR BTL (IV SOLUTION) ×3 IMPLANT
PACK C SECTION WH (CUSTOM PROCEDURE TRAY) ×3 IMPLANT
PAD OB MATERNITY 4.3X12.25 (PERSONAL CARE ITEMS) ×3 IMPLANT
PENCIL SMOKE EVAC W/HOLSTER (ELECTROSURGICAL) ×3 IMPLANT
RTRCTR C-SECT PINK 25CM LRG (MISCELLANEOUS) ×3 IMPLANT
STRIP CLOSURE SKIN 1/2X4 (GAUZE/BANDAGES/DRESSINGS) ×2 IMPLANT
SUT PLAIN 2 0 (SUTURE) ×2
SUT PLAIN ABS 2-0 CT1 27XMFL (SUTURE) ×1 IMPLANT
SUT VIC AB 0 CT1 36 (SUTURE) ×12 IMPLANT
SUT VIC AB 4-0 KS 27 (SUTURE) ×3 IMPLANT
TOWEL OR 17X24 6PK STRL BLUE (TOWEL DISPOSABLE) ×3 IMPLANT
TRAY FOLEY W/BAG SLVR 14FR LF (SET/KITS/TRAYS/PACK) ×3 IMPLANT
WATER STERILE IRR 1000ML POUR (IV SOLUTION) ×3 IMPLANT

## 2019-06-24 NOTE — Anesthesia Procedure Notes (Signed)
Spinal  Patient location during procedure: OB Start time: 06/24/2019 1:58 AM End time: 06/24/2019 2:03 AM Staffing Performed: anesthesiologist  Anesthesiologist: Lowella Curb, MD Preanesthetic Checklist Completed: patient identified, IV checked, risks and benefits discussed, surgical consent, monitors and equipment checked, pre-op evaluation and timeout performed Spinal Block Patient position: sitting Prep: DuraPrep and site prepped and draped Patient monitoring: heart rate, cardiac monitor, continuous pulse ox and blood pressure Approach: midline Location: L3-4 Injection technique: single-shot Needle Needle type: Pencan  Needle gauge: 24 G Needle length: 10 cm Assessment Sensory level: T4

## 2019-06-24 NOTE — Anesthesia Postprocedure Evaluation (Signed)
Anesthesia Post Note  Patient: Leah Gray  Procedure(s) Performed: CESAREAN SECTION (N/A Abdomen)     Patient location during evaluation: PACU Anesthesia Type: Regional Level of consciousness: awake and alert Pain management: pain level controlled Vital Signs Assessment: post-procedure vital signs reviewed and stable Respiratory status: spontaneous breathing, nonlabored ventilation and respiratory function stable Cardiovascular status: blood pressure returned to baseline and stable Postop Assessment: no apparent nausea or vomiting Anesthetic complications: no    Last Vitals:  Vitals:   06/24/19 0415 06/24/19 0430  BP: 109/62 110/63  Pulse: 72 74  Resp: (!) 23 17  Temp:    SpO2: 100% 100%    Last Pain:  Vitals:   06/24/19 0353  TempSrc:   PainSc: 0-No pain   Pain Goal:                Epidural/Spinal Function Cutaneous sensation: Able to Wiggle Toes (06/24/19 0353), Patient able to flex knees: No (06/24/19 0353), Patient able to lift hips off bed: No (06/24/19 0353), Back pain beyond tenderness at insertion site: No (06/24/19 0353), Progressively worsening motor and/or sensory loss: No (06/24/19 0353), Bowel and/or bladder incontinence post epidural: No (06/24/19 0353)  Lowella Curb

## 2019-06-24 NOTE — Discharge Summary (Signed)
Postpartum Discharge Summary     Patient Name: Leah Gray DOB: 09-Feb-1996 MRN: [id number]  Date of admission: 06/22/2019 Delivering Provider:    Zamiyah, Resendes [[id number]]  CONSTANT, PEGGY    Yamila, Cragin [[id number]]  CONSTANT, [place]    Date of discharge: 06/26/2019  Admitting diagnosis: Labor and delivery, indication for care [O75.9] Intrauterine pregnancy: [redacted]w[redacted]d    Secondary diagnosis:  Active Problems:   Monochorionic diamniotic twin pregnancy, antepartum   Rubella non-immune status, antepartum   GDM (gestational diabetes mellitus)   Preterm premature rupture of membranes (PPROM) with unknown onset of labor   Labor and delivery, indication for care  Additional problems: None     Discharge diagnosis: Preterm Pregnancy Delivered and GDM A2                                                                                                Post partum procedures:None  Augmentation: Pitocin and Cytotec  Complications: RBXI>35hours  Hospital course:  Induction of Labor With Cesarean Section  24y.o. yo G3P0020 at 377w4das admitted to the hospital 06/22/2019 for induction of labor. Patient presented to L&D for IOL secondary to PPROM. She received 2 doses of BMZ. Received Pitocin to max dose x2 with Pit break in between and also one dose of Cytotec and did nipple stimulation with no cervical change. The patient went for cesarean section due to failed induction, and delivered two Viable infants,   MaShanisha, Lech0[[id number]]06/24/2019    MaJeanna, Giuffre0[[id number]]06/24/2019   Membrane Rupture Time/Date:    MaSaundra Shelling0[[id number]]5:30 PM    MaLaurina Bustle0[[id number]]2:21 AM  ,   MaLameshia, Hypolite0[[id number]]06/21/2019    MaTilda, Samudio0[[id number]]06/24/2019    Details of operation can be found in separate operative Note. Patient was started on EndoTool intrapartum which was discontinued after c-section.  Fasting AM glucose 107. Patient had an uncomplicated postpartum course. Desires Nexplanon at outpatient visit. She is ambulating, tolerating a regular diet, passing flatus, and urinating well.  Patient is discharged home in stable condition on 06/26/19.                                   Delivery time:    MaSallyanne, Birkhead0[[id number]]2:19 AM    MaMeli, Faley0[[id number]]2:21 AM    Magnesium Sulfate received: No BMZ received: Yes Rhophylac:No MMR:No Transfusion:No  Physical exam  Vitals:   06/25/19 0455 06/25/19 1446 06/25/19 2142 06/26/19 0529  BP: 104/64 109/65 125/74 111/71  Pulse: 72 66 76 75  Resp: 17 16 16 18   Temp: 97.9 F (36.6 C) 98.1 F (36.7 C) 98.2 F (36.8 C) 98.5 F (36.9 C)  TempSrc: Oral Oral Oral Oral  SpO2: 100% 100% 100% 100%  Weight:      Height:       General: alert, cooperative and no distress Lochia: appropriate Uterine Fundus: firm Incision: Healing  well with no significant drainage DVT Evaluation: No evidence of DVT seen on physical exam. Labs: Lab Results  Component Value Date   WBC 14.0 (H) 06/24/2019   HGB 9.6 (L) 06/24/2019   HCT 29.4 (L) 06/24/2019   MCV 96.7 06/24/2019   PLT 310 06/24/2019   CMP Latest Ref Rng & Units 06/24/2019  Glucose 70 - 99 mg/dL -  BUN 6 - 20 mg/dL -  Creatinine 0.44 - 1.00 mg/dL 0.58  Sodium 135 - 145 mmol/L -  Potassium 3.5 - 5.1 mmol/L -  Chloride 98 - 111 mmol/L -  CO2 22 - 32 mmol/L -  Calcium 8.9 - 10.3 mg/dL -  Total Protein 6.5 - 8.1 g/dL -  Total Bilirubin 0.3 - 1.2 mg/dL -  Alkaline Phos 38 - 126 U/L -  AST 15 - 41 U/L -  ALT 0 - 44 U/L -   Edinburgh Score: Edinburgh Postnatal Depression Scale Screening Tool 06/25/2019  I have been able to laugh and see the funny side of things. 0  I have looked forward with enjoyment to things. 0  I have blamed myself unnecessarily when things went wrong. 0  I have been anxious or worried for no good reason. 0  I have felt scared or panicky  for no good reason. 0  Things have been getting on top of me. 0  I have been so unhappy that I have had difficulty sleeping. 0  I have felt sad or miserable. 0  I have been so unhappy that I have been crying. 0  The thought of harming myself has occurred to me. 0  Edinburgh Postnatal Depression Scale Total 0    Discharge instruction: per After Visit Summary and "Baby and Me Booklet".  After visit meds:  Allergies as of 06/26/2019   No Known Allergies     Medication List    STOP taking these medications   Accu-Chek FastClix Lancets Misc   Accu-Chek Guide test strip Generic drug: glucose blood   aspirin EC 81 MG tablet   folic acid 1 MG tablet Commonly known as: FOLVITE   metFORMIN 500 MG tablet Commonly known as: GLUCOPHAGE     TAKE these medications   acetaminophen 325 MG tablet Commonly known as: TYLENOL Take 2 tablets (650 mg total) by mouth every 6 (six) hours as needed for mild pain (temperature > 101.5.).   Blood Pressure Monitoring Devi 1 Device by Does not apply route once a week.   ibuprofen 800 MG tablet Commonly known as: ADVIL Take 1 tablet (800 mg total) by mouth every 6 (six) hours.   oxyCODONE 5 MG immediate release tablet Commonly known as: Oxy IR/ROXICODONE Take 1-2 tablets (5-10 mg total) by mouth every 4 (four) hours as needed for moderate pain.   prenatal multivitamin Tabs tablet Take 1 tablet by mouth daily at 12 noon.   senna-docusate 8.6-50 MG tablet Commonly known as: Senokot-S Take 2 tablets by mouth daily. Start taking on: June 27, 2019       Diet: routine diet  Activity: Advance as tolerated. Pelvic rest for 6 weeks.   Outpatient follow up:4 weeks Follow up Appt:No future appointments. Follow up Visit:    Please schedule this patient for Postpartum visit in: 4 weeks with the following provider: Any provider Virtual For C/S patients schedule nurse incision check in weeks 2 weeks: yes High risk pregnancy complicated by:  GDM Delivery mode:  CS Anticipated Birth Control:  Nexplanon outpatient  PP Procedures needed: incision  check and GTT  Schedule Integrated BH visit: no     Newborn Data:   Delania, Ferg [[id number]]  Live born female  Birth Weight: 2345 g  APGAR: 1, 10  Newborn Delivery   Birth date/time: 06/24/2019 02:19:00 Delivery type:        Balinda, Heacock [[id number]]  Live born female  Birth Weight: 2165 g  APGAR: 9, 10  Newborn Delivery   Birth date/time: 06/24/2019 02:21:00 Delivery type: C-Section, Low Transverse Trial of labor: Yes C-section categorization: Primary      Baby Feeding: Breast Disposition:NICU   06/26/2019 Chauncey Mann, MD

## 2019-06-24 NOTE — Transfer of Care (Signed)
Immediate Anesthesia Transfer of Care Note  Patient: Nathifa Ritthaler  Procedure(s) Performed: CESAREAN SECTION (N/A Abdomen)  Patient Location: PACU  Anesthesia Type:Spinal  Level of Consciousness: awake, alert  and oriented  Airway & Oxygen Therapy: Patient Spontanous Breathing  Post-op Assessment: Report given to RN and Post -op Vital signs reviewed and stable  Post vital signs: Reviewed and stable  Last Vitals:  Vitals Value Taken Time  BP 100/56 06/24/19 0302  Temp    Pulse 82 06/24/19 0308  Resp 16 06/24/19 0308  SpO2 100 % 06/24/19 0308  Vitals shown include unvalidated device data.  Last Pain:  Vitals:   06/24/19 0107  TempSrc: Oral  PainSc:          Complications: No apparent anesthesia complications

## 2019-06-24 NOTE — Progress Notes (Signed)
Patient screened out for psychosocial assessment since none of the following apply:  Psychosocial stressors documented in mother or baby's chart  Gestation less than 32 weeks  Code at delivery   Infant with anomalies Please contact the Clinical Social Worker if specific needs arise, by MOB's request, or if MOB scores greater than 9/yes to question 10 on Edinburgh Postpartum Depression Screen.  Kelley Polinsky, LCSW Clinical Social Worker [facility] Cell#: [phone]     

## 2019-06-24 NOTE — Op Note (Signed)
[facility] PROCEDURE DATE: 06/22/2019 - 06/24/2019  PREOPERATIVE DIAGNOSIS: Intrauterine pregnancy at  [redacted]w[redacted]d weeks gestation; failure to progress: arrest of dilation  POSTOPERATIVE DIAGNOSIS: The same  PROCEDURE:     Cesarean Section  SURGEON:  Dr. Catalina Antigua  ASSISTANT: Dr. Jerilynn Birkenhead  INDICATIONS: Leah Gray is a 24 y.o. G3P0020 at [redacted]w[redacted]d scheduled for cesarean section secondary to failure to progress: arrest of dilation.  The risks of cesarean section discussed with the patient included but were not limited to: bleeding which may require transfusion or reoperation; infection which may require antibiotics; injury to bowel, bladder, ureters or other surrounding organs; injury to the fetus; need for additional procedures including hysterectomy in the event of a life-threatening hemorrhage; placental abnormalities wth subsequent pregnancies, incisional problems, thromboembolic phenomenon and other postoperative/anesthesia complications. The patient concurred with the proposed plan, giving informed written consent for the procedure.    FINDINGS:  Viable female infants in cephalic presentation x 2.  Apgars 8 and 9 x 2.  Clear amniotic fluid x 2.  Intact placenta, three vessel cord.  Normal uterus, fallopian tubes and ovaries bilaterally.  ANESTHESIA:    Spinal INTRAVENOUS FLUIDS:2700 ml ESTIMATED BLOOD LOSS: 607 ml URINE OUTPUT:  300 ml SPECIMENS: Placenta sent to pathology COMPLICATIONS: None immediate  PROCEDURE IN DETAIL:  The patient received intravenous antibiotics and had sequential compression devices applied to her lower extremities while in the preoperative area.  She was then taken to the operating room where anesthesia was induced and was found to be adequate. A foley catheter was placed into her bladder and attached to Ulysses Alper gravity. She was then placed in a dorsal supine position with a leftward tilt, and prepped and draped in a sterile manner. After an adequate timeout  was performed, a Pfannenstiel skin incision was made with scalpel and carried through to the underlying layer of fascia. The fascia was incised in the midline and this incision was extended bilaterally using the Mayo scissors. Kocher clamps were applied to the superior aspect of the fascial incision and the underlying rectus muscles were dissected off bluntly. A similar process was carried out on the inferior aspect of the facial incision. The rectus muscles were separated in the midline bluntly and the peritoneum was entered bluntly. The Alexis self-retaining retractor was introduced into the abdominal cavity. Attention was turned to the lower uterine segment where a transverse hysterotomy was made with a scalpel and extended bilaterally bluntly. The infants were successfully delivered. Delayed cord clamping was performed for 1 minute x 2. The cords were clamped and cut and infants were handed over to awaiting neonatology team. Uterine massage was then administered and the placenta delivered intact with three-vessel cord. The uterus was cleared of clot and debris.  The hysterotomy was closed with 0 Vicryl in a running locked fashion, and an imbricating layer was also placed with a 0 Vicryl. Overall, excellent hemostasis was noted. The pelvis copiously irrigated and cleared of all clot and debris. Hemostasis was confirmed on all surfaces.  The peritoneum and the muscles were reapproximated using 0 vicryl interrupted stitches. The fascia was then closed using 0 Vicryl in a running fashion.  The subcutaneous layer was reapproximated with plain gut and the skin was closed in a subcuticular fashion using 3.0 Vicryl. The patient tolerated the procedure well. Sponge, lap, instrument and needle counts were correct x 2. She was taken to the recovery room in stable condition.    [name] ConstantMD  06/24/2019 2:38 AM

## 2019-06-24 NOTE — Progress Notes (Signed)
Went bedside to assess patient 4+ hours after Cytotec. Cervix unchanged from my exam on evening of 2/24 (3.5/90/-2) and essentially since arrival that day despite 2 trials up to max doses of Pitocin. Discussed options with patient including waiting and retrying induction in a few days, another dose of Cytotec/restarting Pitocin or proceeding with C-section for failed induction. Patient opts to proceed with C-section at this time. D/w Dr. Jolayne Panther. The risks of cesarean section were discussed with the patient including but were not limited to: bleeding which may require transfusion or reoperation; infection which may require antibiotics; injury to bowel, bladder, ureters or other surrounding organs; injury to the fetus; need for additional procedures including hysterectomy in the event of a life-threatening hemorrhage; placental abnormalities wth subsequent pregnancies, incisional problems, thromboembolic phenomenon and other postoperative/anesthesia complications.  Patient also desires permanent sterilization.  Other reversible forms of contraception were discussed with patient; she declines all other modalities. Risks of procedure discussed with patient including but not limited to: risk of regret, permanence of method, bleeding, infection, injury to surrounding organs and need for additional procedures.  Failure risk of about 1% with increased risk of ectopic gestation if pregnancy occurs was also discussed with patient.  Also discussed possibility of post-tubal pain syndrome. The patient concurred with the proposed plan, giving informed written consent for the procedures.  Patient has been NPO since 1800 on 2/25 she will remain NPO for procedure. Anesthesia and OR aware.  Preoperative prophylactic antibiotics and SCDs ordered on call to the OR.  To OR when ready.  Jerilynn Birkenhead, MD [facility] Family Medicine Fellow, [facility] for [employer], [facility]

## 2019-06-24 NOTE — Lactation Note (Signed)
This note was copied from a baby's chart. Lactation Consultation Note:  Mother is a P19, Mother del twin males at 50. 4 weeks. Infant were transferred to NICU with low CBG and low Temps.  Mother was given [facility] brochure and Breastfeeding Your NICU Baby.     Reviewed hand expression with mother. Observed large drops of colostrum. obtained 2 ml of colostrum in a colostrum vial.   Mother was given a harmony hand pump with instructions. Mothers nipples are erect with compressible breast tissue. No observed trama of mothers nipples.   She is active with WIC . Mother was sat up with  a DEBP . She was assist with pumping and obtained 1 ml. #24 flange was a good fit for mother.  Reviewed collection, storage and transporting milk.   Pump using a DEBP every 2-3 hours for 15-20 mins.   WIC referral faxed to [facility] to secure a DEBP for mother.   Mother to continue to due STS. Mother is aware of available LC services at [facility], BFSG'S, OP Dept, and phone # for questions or concerns about breastfeeding.  Mother receptive to all teaching and plan of care.    Patient Name: Leah Gray Date: 06/24/2019 Reason for consult: Initial assessment;NICU baby;Late-preterm 34-36.6wks;Multiple gestation   Maternal Data Has patient been taught Hand Expression?: Yes Does the patient have breastfeeding experience prior to this delivery?: No  Feeding Feeding Type: Formula  LATCH Score                   Interventions    Lactation Tools Discussed/Used WIC Program: Yes Pump Review: Setup, frequency, and cleaning;Milk Storage Initiated by:: Stevan Born RN, IBCLC Date initiated:: 06/24/19   Consult Status Consult Status: Follow-up Date: 06/25/19 Follow-up type: In-patient    Stevan Born [facility] 06/24/2019, 3:56 PM

## 2019-06-24 NOTE — Anesthesia Preprocedure Evaluation (Signed)
Anesthesia Evaluation  Patient identified by MRN, date of birth, ID band Patient awake    Reviewed: Allergy & Precautions, NPO status , Patient's Chart, lab work & pertinent test results  Airway Mallampati: II  TM Distance: >3 FB Neck ROM: Full    Dental no notable dental hx.    Pulmonary neg pulmonary ROS, former smoker,    Pulmonary exam normal breath sounds clear to auscultation       Cardiovascular negative cardio ROS Normal cardiovascular exam Rhythm:Regular Rate:Normal     Neuro/Psych negative neurological ROS  negative psych ROS   GI/Hepatic negative GI ROS, Neg liver ROS,   Endo/Other  negative endocrine ROSdiabetes  Renal/GU negative Renal ROS  negative genitourinary   Musculoskeletal negative musculoskeletal ROS (+)   Abdominal   Peds negative pediatric ROS (+)  Hematology negative hematology ROS (+)   Anesthesia Other Findings   Reproductive/Obstetrics (+) Pregnancy                             Anesthesia Physical  Anesthesia Plan  ASA: II  Anesthesia Plan: Regional   Post-op Pain Management:    Induction:   PONV Risk Score and Plan: 2 and Ondansetron and Treatment may vary due to age or medical condition  Airway Management Planned: Natural Airway  Additional Equipment:   Intra-op Plan:   Post-operative Plan:   Informed Consent: I have reviewed the patients History and Physical, chart, labs and discussed the procedure including the risks, benefits and alternatives for the proposed anesthesia with the patient or authorized representative who has indicated his/her understanding and acceptance.     Dental advisory given  Plan Discussed with: CRNA  Anesthesia Plan Comments:         Anesthesia Quick Evaluation

## 2019-06-25 LAB — GLUCOSE, CAPILLARY: Glucose-Capillary: 76 mg/dL (ref 70–99)

## 2019-06-25 LAB — CULTURE, BETA STREP (GROUP B ONLY): Strep Gp B Culture: NEGATIVE

## 2019-06-25 NOTE — Lactation Note (Signed)
This note was copied from a baby's chart. Lactation Consultation Note  Patient Name: Leah Gray FPOIP'P Date: 06/25/2019 Reason for consult: Follow-up assessment;NICU baby;Multiple gestation;Late-preterm 34-36.6wks Babies are 33 hours in the NICU.  They are receiving formula gavage feeds.  Mom has only pumped once yesterday.  She is preparing to pump now.  Reviewed pump and answered questions.  Stressed importance of pumping every 3 hours to establish a good milk supply.  Mom will ask RN if she can put babies to breast and call us for assist prn.  Maternal Data    Feeding Feeding Type: Formula  LATCH Score                   Interventions    Lactation Tools Discussed/Used     Consult Status Consult Status: Follow-up Date: 06/26/19 Follow-up type: In-patient    Leah Gray 06/25/2019, 11:51 AM

## 2019-06-25 NOTE — Progress Notes (Addendum)
Post Op Day 1 for pLTCS for failed IOL for PPROM. Subjective: no complaints, up ad lib, voiding, tolerating PO and + flatus  Objective: Blood pressure 104/64, pulse 72, temperature 97.9 F (36.6 C), temperature source Oral, resp. rate 17, height 5\' 1"  (1.549 m), weight 83 kg, last menstrual period 10/18/2018, SpO2 100 %, unknown if currently breastfeeding.  Physical Exam:  General: alert, cooperative, appears stated age and no distress Lochia: appropriate Uterine Fundus: firm Incision: healing well, no significant drainage, no dehiscence DVT Evaluation: No evidence of DVT seen on physical exam. No significant calf/ankle edema.  Recent Labs    06/22/19 1311 06/24/19 0602  HGB 12.4 9.6*  HCT 37.6 29.4*    Assessment/Plan: Breastfeeding and Contraception - interested in Nexplanon  A2GDM: most recent BS 107   LOS: 3 days   06/26/19 06/25/2019, 8:19 AM   I saw and evaluated the patient. I agree with the findings and the plan of care as documented in the resident's note. Cont PO iron. Plan for discharge tomorrow or 3/1 per patient request.   06/27/2019, MD [facility] Family Medicine Fellow, [facility] for [facility], [facility]

## 2019-06-25 NOTE — Progress Notes (Signed)
Fasting blood sugar 76

## 2019-06-26 MED ORDER — IBUPROFEN 800 MG PO TABS: 800.0000 mg | ORAL_TABLET | Freq: Four times a day (QID) | ORAL | 0 refills | Status: DC

## 2019-06-26 MED ORDER — SENNOSIDES-DOCUSATE SODIUM 8.6-50 MG PO TABS: 2.0000 | ORAL_TABLET | ORAL | 0 refills | Status: DC

## 2019-06-26 MED ORDER — OXYCODONE HCL 5 MG PO TABS: 5.0000 mg | ORAL_TABLET | ORAL | 0 refills | Status: DC | PRN

## 2019-06-26 MED ORDER — ACETAMINOPHEN 325 MG PO TABS: 650.0000 mg | ORAL_TABLET | Freq: Four times a day (QID) | ORAL | 0 refills | Status: DC | PRN

## 2019-06-27 LAB — SURGICAL PATHOLOGY

## 2019-06-28 ENCOUNTER — Ambulatory Visit (HOSPITAL_COMMUNITY): Payer: Medicaid Other

## 2019-06-30 ENCOUNTER — Telehealth: Payer: Medicaid Other | Admitting: Obstetrics and Gynecology

## 2019-07-05 ENCOUNTER — Ambulatory Visit: Payer: Self-pay

## 2019-07-05 NOTE — Lactation Note (Signed)
This note was copied from a baby's chart. Lactation Consultation Note  Patient Name: Leah Gray [id number] Date: 07/05/2019 Reason for consult: Follow-up assessment;Mother's request;NICU baby Assist with feeding baby boy A in cradle position on moms left breast. Infant cuing.  And openong wide ready to latch. Baby latched and breastfed well and tuckered out in about 10 minutes. This is parents second breastfeeding attempt.  Reviewed what to expect and LPTI guidelines. Towards the end of feed as infant getting sleepy, stridor heard. Infant let go and came off.    Assist with breastfeeding Baby B. Baby B Breasfed at 9 pm in cradle hold.Good breastfeeding observed.  Minimal assist needed..  Baby B latched and breastfed well for close to 15 minutes.  Mom reports comfort.   Urged mom to pump with DEBP more often and  pump to bedside.Urged to add massage and hand expression to get more milk.   Urged mom to call lactation as needed Maternal Data    Feeding Feeding Type: Breast Milk Nipple Type: Nfant Extra Slow Flow (gold)  LATCH Score Latch: Grasps breast easily, tongue down, lips flanged, rhythmical sucking.  Audible Swallowing: Spontaneous and intermittent  Type of Nipple: Everted at rest and after stimulation  Comfort (Breast/Nipple): Soft / non-tender  Hold (Positioning): Assistance needed to correctly position infant at breast and maintain latch.  LATCH Score: 9  Interventions Interventions: Assisted with latch  Lactation Tools Discussed/Used     Consult Status Consult Status: PRN Date: 07/06/19 Follow-up type: In-patient    [facility] Michaelle Copas 07/05/2019, 11:54 PM

## 2019-07-06 ENCOUNTER — Telehealth: Payer: Medicaid Other | Admitting: Obstetrics & Gynecology

## 2019-07-08 ENCOUNTER — Other Ambulatory Visit: Payer: Self-pay

## 2019-07-08 ENCOUNTER — Ambulatory Visit (INDEPENDENT_AMBULATORY_CARE_PROVIDER_SITE_OTHER): Payer: Medicaid Other

## 2019-07-08 VITALS — BP 120/76 | HR 90 | Wt 158.4 lb

## 2019-07-08 DIAGNOSIS — Z5189 Encounter for other specified aftercare: Secondary | ICD-10-CM

## 2019-07-08 NOTE — Progress Notes (Signed)
Pt here today for incision check s/p c-section on 06/24/19.  Pt reports that she is having no pain but some vaginal spotting.  Pt presents with honeycomb dressing placed.  Removed honeycomb dressing and steri strips.  Incision well approximated- no odor, no drainage, no edema, and no erythema.  Pt advised to continue to monitor for infection and allow warm, soapy water to run over the incision, and pat dry. Pt's appts confirmed.  Pt verbalized understanding to her pp visit and 2 hr appt with no further questions.   Addison Naegeli, RN 07/08/19

## 2019-07-10 NOTE — Progress Notes (Signed)
Patient seen and assessed by nursing staff during this encounter. I have reviewed the chart and agree with the documentation and plan.  [place] Bing, MD 07/10/2019 7:29 PM

## 2019-07-13 ENCOUNTER — Telehealth: Payer: Medicaid Other | Admitting: Obstetrics and Gynecology

## 2019-07-15 ENCOUNTER — Ambulatory Visit: Payer: Self-pay

## 2019-07-15 NOTE — Lactation Note (Signed)
This note was copied from a baby's chart. Lactation Consultation Note  Patient Name: Leah Gray [id number] Date: 07/15/2019 Reason for consult: Follow-up assessment;NICU baby;Late-preterm 34-36.6wks;Multiple gestation  Visited with mom of a 15 week old infant NICU female, baby "B" he's going home today but baby "A" his twin brother is still staying in the hospital. Mom plans to do both, breast and formula as per feeding choice on admission, baby is currently on breast milk and Similac 24 calorie formula.  NICU NP was in the room at the time of [facility] consultation and she explained to mom that NICU staff will provide two different recipes for her to for her to prepare a higher calorie count formula with her own breastmilk and with using water if she's just preparing formula.  Mom is pumping every 4 hours and getting about 2 oz. Per pumping session. Explained to mom the importance of fully emptying the breast each time in order to protect her supply and to pump more often if that's her goal, since she has twins; ideally 8 pumping sessions/24 hours; she voiced understanding.  She'll still be coming to the hospital for baby "A", but reviewed some discharge instructions and let her know that she can still page LC anytime. Mom reported all questions and concerns were answered, she's aware of LC OP services and will call PRN.   Maternal Data    Feeding Feeding Type: Formula Nipple Type: Dr. Levert Feinstein Preemie  LATCH Score                   Interventions Interventions: Breast feeding basics reviewed;DEBP  Lactation Tools Discussed/Used Tools: Pump Breast pump type: Double-Electric Breast Pump   Consult Status Consult Status: PRN    Leah Gray S Jaycen Vercher 07/15/2019, 3:08 PM

## 2019-07-28 ENCOUNTER — Other Ambulatory Visit: Payer: Self-pay | Admitting: General Practice

## 2019-07-28 DIAGNOSIS — O0990 Supervision of high risk pregnancy, unspecified, unspecified trimester: Secondary | ICD-10-CM

## 2019-08-01 ENCOUNTER — Telehealth (INDEPENDENT_AMBULATORY_CARE_PROVIDER_SITE_OTHER): Payer: Self-pay | Admitting: Nurse Practitioner

## 2019-08-01 ENCOUNTER — Encounter: Payer: Self-pay | Admitting: Nurse Practitioner

## 2019-08-01 DIAGNOSIS — Z8759 Personal history of other complications of pregnancy, childbirth and the puerperium: Secondary | ICD-10-CM | POA: Insufficient documentation

## 2019-08-01 DIAGNOSIS — Z8632 Personal history of gestational diabetes: Secondary | ICD-10-CM

## 2019-08-01 NOTE — Progress Notes (Signed)
Subjective:     Leah Gray is a 24 y.o. female who presents for a postpartum visit. She is 5 weeks 3d postpartum following a low cervical transverse Cesarean section. I have fully reviewed the prenatal and intrapartum course. The delivery was at 35wks4d gestational weeks for a twin gestation. Outcome: primary cesarean section, low vertical incision. Anesthesia: spinal. Postpartum course has been good. Baby's course has been complicated with one month NICU stay to gain wt.  Baby is feeding by both breast and bottle - Alimentum for Baby A and Similac Neosure Baby B. Bleeding no bleeding. Bowel function is normal. Bladder function is normal. Patient is not sexually active. Contraception method is none. Postpartum depression screening: negative. The following portions of the patient's history were reviewed and updated as appropriate: allergies, current medications, past family history, past medical history, past social history, past surgical history and problem list.  Review of Systems Pertinent items noted in HPI and remainder of comprehensive ROS otherwise negative.   Objective:    There were no vitals taken for this visit. Virtual visit on MyChart .Exam limited by virtual visit. General:  alert, cooperative and no distress                                      Assessment:    Normal postpartum visit. Pap smear not done at today's visit.   Plan:    1. Contraception: desires nexplanon but does not have insurance. 2. Schedule for RN visit for BP check and to get paperwork to fill out for financial assistance.  Also needs 2 hr glucola done and Nexplanon inserted once financial assistance papers are filled out as no insurance currently. 3. Follow up in: 1 week or as needed.     Nolene Bernheim, RN, MSN, NP-BC Nurse Practitioner, [facility] for [employer], [facility] 08/01/2019 2:34 PM

## 2019-08-05 ENCOUNTER — Other Ambulatory Visit: Payer: Self-pay

## 2019-08-05 ENCOUNTER — Other Ambulatory Visit: Payer: Medicaid Other

## 2019-08-05 DIAGNOSIS — O0990 Supervision of high risk pregnancy, unspecified, unspecified trimester: Secondary | ICD-10-CM

## 2019-08-06 LAB — GLUCOSE TOLERANCE, 2 HOURS
Glucose, 2 hour: 94 mg/dL (ref 65–139)
Glucose, GTT - Fasting: 87 mg/dL (ref 65–99)

## 2019-09-06 ENCOUNTER — Ambulatory Visit (INDEPENDENT_AMBULATORY_CARE_PROVIDER_SITE_OTHER): Payer: Medicaid Other | Admitting: Nurse Practitioner

## 2019-09-06 ENCOUNTER — Encounter: Payer: Self-pay | Admitting: Nurse Practitioner

## 2019-09-06 ENCOUNTER — Other Ambulatory Visit: Payer: Self-pay

## 2019-09-06 VITALS — BP 96/64 | HR 69 | Ht 61.0 in | Wt 160.0 lb

## 2019-09-06 DIAGNOSIS — Z3202 Encounter for pregnancy test, result negative: Secondary | ICD-10-CM

## 2019-09-06 DIAGNOSIS — Z30017 Encounter for initial prescription of implantable subdermal contraceptive: Secondary | ICD-10-CM | POA: Diagnosis not present

## 2019-09-06 LAB — POCT PREGNANCY, URINE: Preg Test, Ur: NEGATIVE

## 2019-09-06 MED ORDER — ETONOGESTREL 68 MG ~~LOC~~ IMPL
68.0000 mg | DRUG_IMPLANT | Freq: Once | SUBCUTANEOUS | Status: AC
  Administered 2019-09-06: 11:00:00 68 mg via SUBCUTANEOUS

## 2019-09-06 NOTE — Progress Notes (Signed)
     GYNECOLOGY OFFICE PROCEDURE NOTE  Marco Adelson is a 24 y.o. [phone] here for Nexplanon insertion.  Last pap smear was on 11-15-19 and was normal.  No other gynecologic concerns. Did have intercourse 3 days ago.  Advised pregnancy test that is negative today is not totally accurate as it has not been 2 weeks since last intercourse.  Advised that we could reschedule nexplanon insertion but client requests to have Nexplanon inserted today.  Advised no intercourse for 2 weeks so nexplanon will be contraceptive at that time and advised to have a home pregnancy test in 2 weeks to verify that she is not pregnancy.  [place] agrees with this plan.  Nexplanon Insertion Patient identified, informed consent performed, consent signed.   Appropriate time out taken. Nexplanon site identified per company guidelines.  Area prepped in usual sterile fashon.  Three ml of 1% lidocaine was used to anesthetize the area at the distal end of the implant.  nexplanon Rod inserted by company guidelines.  Client and provider palpated the rod after insertion. There was minimal blood loss. There were no complications.   A pressure bandage was applied to reduce any bruising.  The patient tolerated the procedure well and was given post procedure instructions.  Patient is planning to use abstinence for 2 weeks.  Nolene Bernheim, RN, MSN, NP-BC Nurse Practitioner, [facility] for [employer], [facility] 09/06/2019 11:38 AM

## 2019-09-06 NOTE — Patient Instructions (Signed)
No sex for 2 weeks Do a home pregnancy test in 3 weeks. Return if positive.

## 2019-11-01 ENCOUNTER — Encounter: Payer: Self-pay | Admitting: Nurse Practitioner

## 2019-11-08 ENCOUNTER — Encounter: Payer: Self-pay | Admitting: Nurse Practitioner

## 2019-11-08 ENCOUNTER — Ambulatory Visit (INDEPENDENT_AMBULATORY_CARE_PROVIDER_SITE_OTHER): Payer: Medicaid Other | Admitting: Nurse Practitioner

## 2019-11-08 ENCOUNTER — Other Ambulatory Visit: Payer: Self-pay

## 2019-11-08 VITALS — BP 120/80 | Ht 61.0 in | Wt 163.4 lb

## 2019-11-08 DIAGNOSIS — Z975 Presence of (intrauterine) contraceptive device: Secondary | ICD-10-CM

## 2019-11-08 DIAGNOSIS — Z01419 Encounter for gynecological examination (general) (routine) without abnormal findings: Secondary | ICD-10-CM | POA: Diagnosis not present

## 2019-11-08 NOTE — Patient Instructions (Signed)
Health Maintenance, Female Adopting a healthy lifestyle and getting preventive care are important in promoting health and wellness. Ask your health care provider about:  The right schedule for you to have regular tests and exams.  Things you can do on your own to prevent diseases and keep yourself healthy. What should I know about diet, weight, and exercise? Eat a healthy diet   Eat a diet that includes plenty of vegetables, fruits, low-fat dairy products, and lean protein.  Do not eat a lot of foods that are high in solid fats, added sugars, or sodium. Maintain a healthy weight Body mass index (BMI) is used to identify weight problems. It estimates body fat based on height and weight. Your health care provider can help determine your BMI and help you achieve or maintain a healthy weight. Get regular exercise Get regular exercise. This is one of the most important things you can do for your health. Most adults should:  Exercise for at least 150 minutes each week. The exercise should increase your heart rate and make you sweat (moderate-intensity exercise).  Do strengthening exercises at least twice a week. This is in addition to the moderate-intensity exercise.  Spend less time sitting. Even light physical activity can be beneficial. Watch cholesterol and blood lipids Have your blood tested for lipids and cholesterol at 26 years of age, then have this test every 5 years. Have your cholesterol levels checked more often if:  Your lipid or cholesterol levels are high.  You are older than 26 years of age.  You are at high risk for heart disease. What should I know about cancer screening? Depending on your health history and family history, you may need to have cancer screening at various ages. This may include screening for:  Breast cancer.  Cervical cancer.  Colorectal cancer.  Skin cancer.  Lung cancer. What should I know about heart disease, diabetes, and high blood  pressure? Blood pressure and heart disease  High blood pressure causes heart disease and increases the risk of stroke. This is more likely to develop in people who have high blood pressure readings, are of African descent, or are overweight.  Have your blood pressure checked: ? Every 3-5 years if you are 18-39 years of age. ? Every year if you are 40 years old or older. Diabetes Have regular diabetes screenings. This checks your fasting blood sugar level. Have the screening done:  Once every three years after age 40 if you are at a normal weight and have a low risk for diabetes.  More often and at a younger age if you are overweight or have a high risk for diabetes. What should I know about preventing infection? Hepatitis B If you have a higher risk for hepatitis B, you should be screened for this virus. Talk with your health care provider to find out if you are at risk for hepatitis B infection. Hepatitis C Testing is recommended for:  Everyone born from 1945 through 1965.  Anyone with known risk factors for hepatitis C. Sexually transmitted infections (STIs)  Get screened for STIs, including gonorrhea and chlamydia, if: ? You are sexually active and are younger than 26 years of age. ? You are older than 26 years of age and your health care provider tells you that you are at risk for this type of infection. ? Your sexual activity has changed since you were last screened, and you are at increased risk for chlamydia or gonorrhea. Ask your health care provider if   you are at risk.  Ask your health care provider about whether you are at high risk for HIV. Your health care provider may recommend a prescription medicine to help prevent HIV infection. If you choose to take medicine to prevent HIV, you should first get tested for HIV. You should then be tested every 3 months for as long as you are taking the medicine. Pregnancy  If you are about to stop having your period (premenopausal) and  you may become pregnant, seek counseling before you get pregnant.  Take 400 to 800 micrograms (mcg) of folic acid every day if you become pregnant.  Ask for birth control (contraception) if you want to prevent pregnancy. Osteoporosis and menopause Osteoporosis is a disease in which the bones lose minerals and strength with aging. This can result in bone fractures. If you are 65 years old or older, or if you are at risk for osteoporosis and fractures, ask your health care provider if you should:  Be screened for bone loss.  Take a calcium or vitamin D supplement to lower your risk of fractures.  Be given hormone replacement therapy (HRT) to treat symptoms of menopause. Follow these instructions at home: Lifestyle  Do not use any products that contain nicotine or tobacco, such as cigarettes, e-cigarettes, and chewing tobacco. If you need help quitting, ask your health care provider.  Do not use street drugs.  Do not share needles.  Ask your health care provider for help if you need support or information about quitting drugs. Alcohol use  Do not drink alcohol if: ? Your health care provider tells you not to drink. ? You are pregnant, may be pregnant, or are planning to become pregnant.  If you drink alcohol: ? Limit how much you use to 0-1 drink a day. ? Limit intake if you are breastfeeding.  Be aware of how much alcohol is in your drink. In the [place] one drink equals one 12 oz bottle of beer (355 mL), one 5 oz glass of wine (148 mL), or one 1 oz glass of hard liquor (44 mL). General instructions  Schedule regular health, dental, and eye exams.  Stay current with your vaccines.  Tell your health care provider if: ? You often feel depressed. ? You have ever been abused or do not feel safe at home. Summary  Adopting a healthy lifestyle and getting preventive care are important in promoting health and wellness.  Follow your health care provider's instructions about healthy  diet, exercising, and getting tested or screened for diseases.  Follow your health care provider's instructions on monitoring your cholesterol and blood pressure. This information is not intended to replace advice given to you by your health care provider. Make sure you discuss any questions you have with your health care provider. Document Revised: 04/07/2018 Document Reviewed: 04/07/2018 Elsevier Patient Education  2020 Elsevier Inc.  

## 2019-11-08 NOTE — Progress Notes (Signed)
   Sita Mangen May 10, 1995 [id number]   History:  24 y.o. [id number] presents for annual exam. 2019 ectopic pregnancy, 08/2018 ectopic pregnancy with right salpingectomy. Twin delivery by cesarean 06/22/2019. Nexplanon placed 09/06/2019, has had some irregular bleeding. Wants to discuss depo provera. Mother diagnosed with breast cancer at age 61.   Gynecologic History Patient's last menstrual period was 10/21/2019 (exact date).   Contraception: Nexplanon Last Pap: 10/27/2018. Results were: normal   Past medical history, past surgical history, family history and social history were all reviewed and documented in the EPIC chart.  ROS:  A ROS was performed and pertinent positives and negatives are included.  Exam:  Vitals:   11/08/19 1559  BP: 120/80  Weight: 163 lb 6.4 oz (74.1 kg)  Height: 5\' 1"  (1.549 m)   Body mass index is 30.87 kg/m.  General appearance:  Normal Thyroid:  Symmetrical, normal in size, without palpable masses or nodularity. Respiratory  Auscultation:  Clear without wheezing or rhonchi Cardiovascular  Auscultation:  Regular rate, without rubs, murmurs or gallops  Edema/varicosities:  Not grossly evident Abdominal  Soft,nontender, without masses, guarding or rebound. C-section scar on mid lower abdomen  Liver/spleen:  No organomegaly noted  Hernia:  None appreciated  Skin  Inspection:  Grossly normal   Breasts: Examined lying and sitting.   Right: Without masses, retractions, discharge or axillary adenopathy.   Left: Without masses, retractions, discharge or axillary adenopathy. Gentitourinary   Inguinal/mons:  Normal without inguinal adenopathy  External genitalia:  Normal  BUS/Urethra/Skene's glands:  Normal  Vagina:  Normal  Cervix:  Normal  Uterus:  Anteverted, normal in size, shape and contour.  Midline and mobile  Adnexa/parametria:     Rt: Without masses or tenderness.   Lt: Without masses or tenderness.  Anus and  perineum: Normal   Assessment/Plan:  24 y.o. G3 P0122 for annual exam.   Well woman exam with routine gynecological exam - Education provided on SBEs, importance of preventative screenings, current guidelines, high calcium diet, regular exercise, and multivitamin daily. Discussed early breast cancer screenings due to mother's history of breast cancer.   Nexplanon in place - palpable in left arm, healed nicely. Educated on what to expect with Nexplanon and possibly taking 3-6 months for spotting/bleeding to improve. She will give it a couple more months and if bleeding does not improve she would like to try Depo Provera.   Follow up in 1 year for annual      01-31-1985 [facility], 4:36 PM 11/08/2019

## 2019-11-15 ENCOUNTER — Ambulatory Visit: Payer: Medicaid Other | Admitting: Student

## 2020-03-07 DIAGNOSIS — H5213 Myopia, bilateral: Secondary | ICD-10-CM | POA: Diagnosis not present

## 2020-03-08 DIAGNOSIS — H5213 Myopia, bilateral: Secondary | ICD-10-CM | POA: Diagnosis not present

## 2020-07-11 IMAGING — US US MFM OB DETAIL+14 WK
1 series · 14 of 28 positions shown · non-contrast
Comparison: none

[Series 1: us mfm ob detail+14 wk · 14 of 212 slices shown]
[im 8/212]
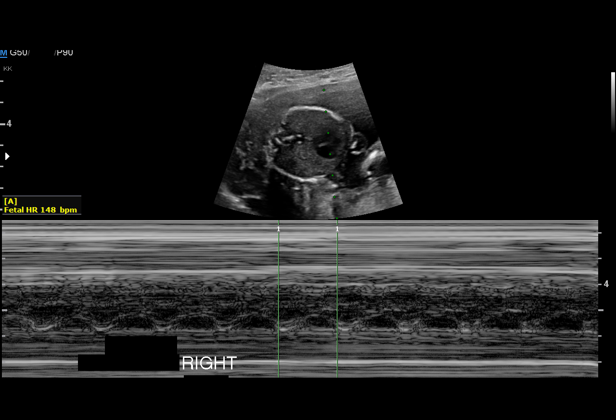
[im 24/212]
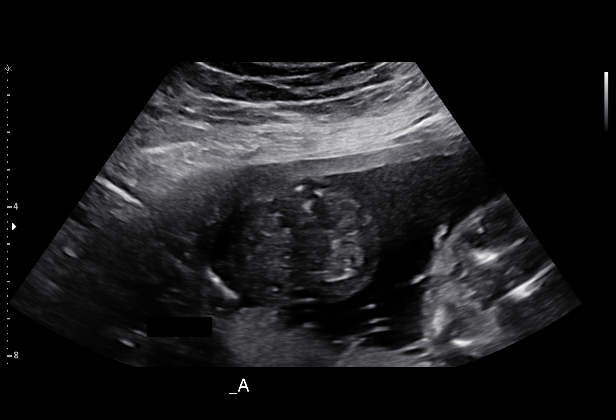
[im 40/212]
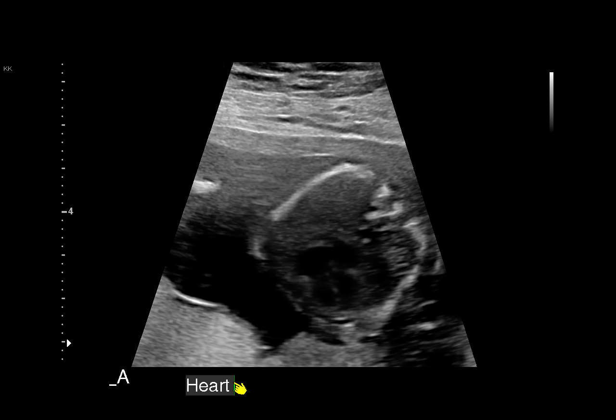
[im 55/212]
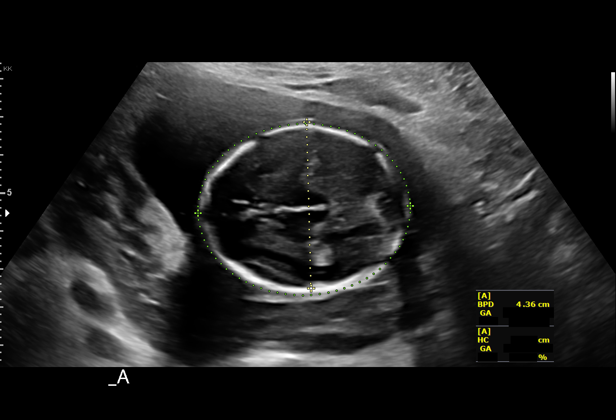
[im 71/212]
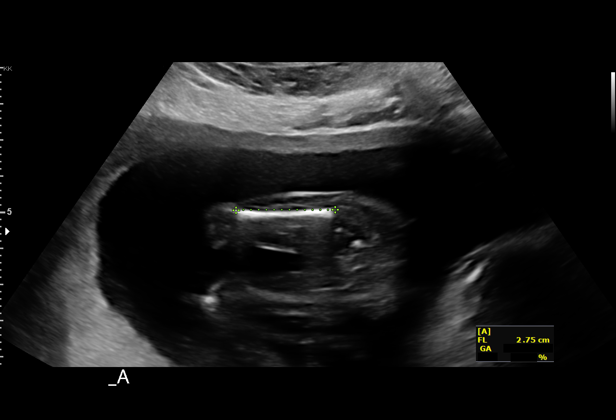
[im 86/212]
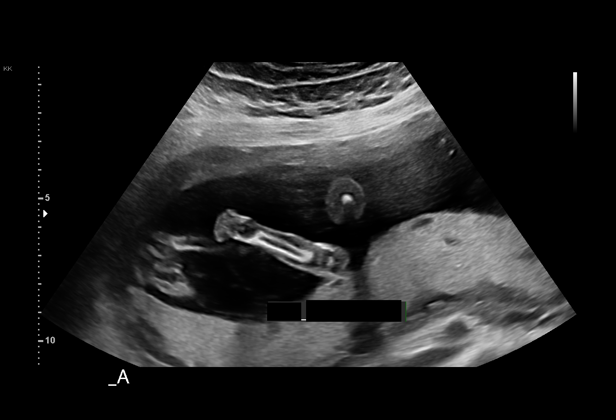
[im 102/212]
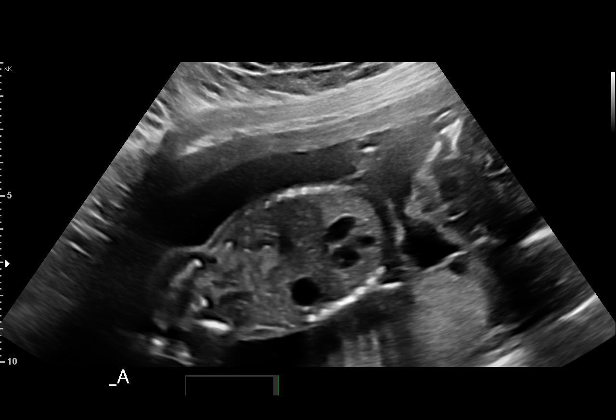
[im 118/212]
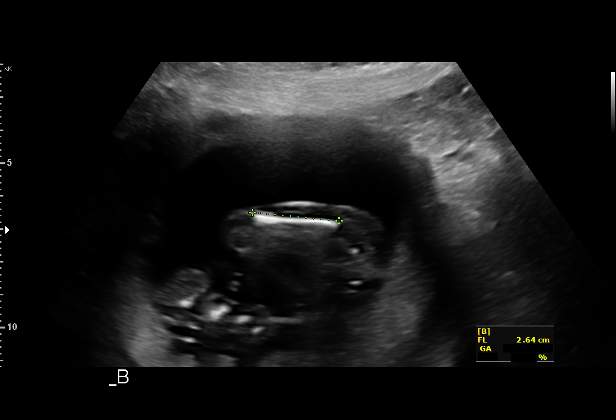
[im 133/212]
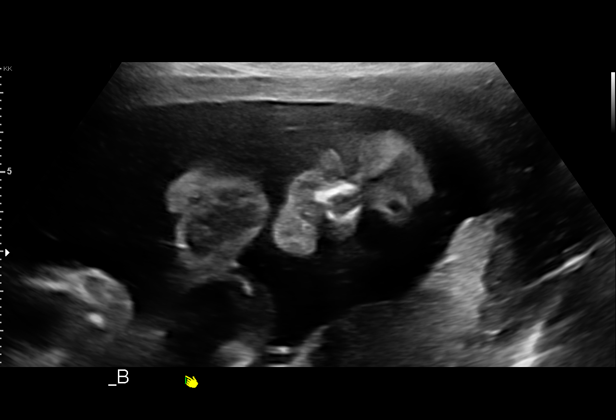
[im 149/212]
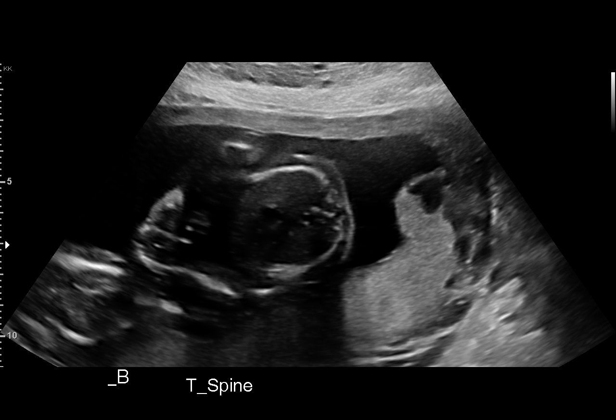
[im 165/212]
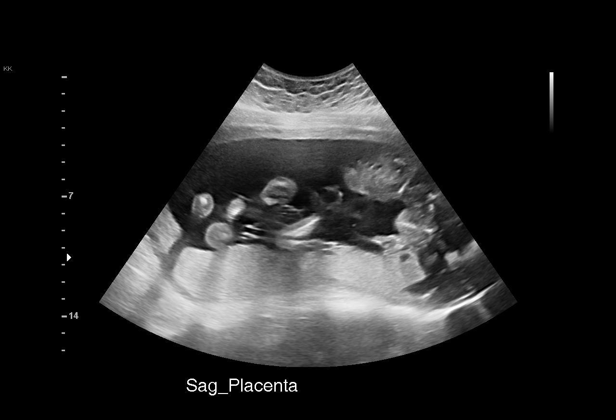
[im 180/212]
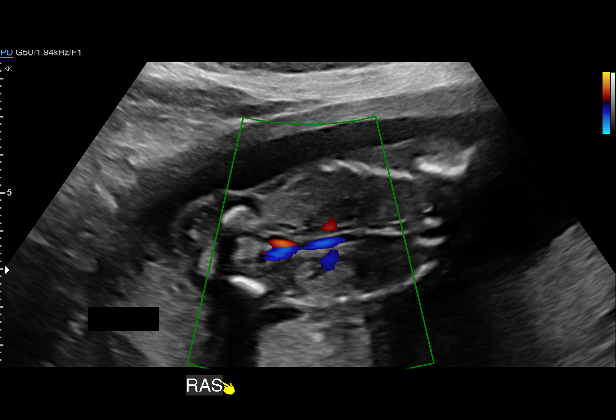
[im 196/212]
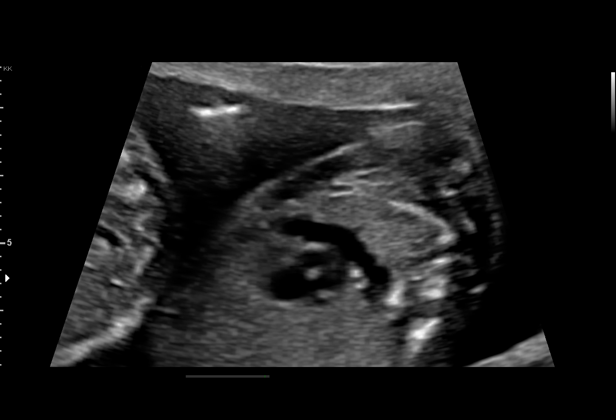
[im 212/212]
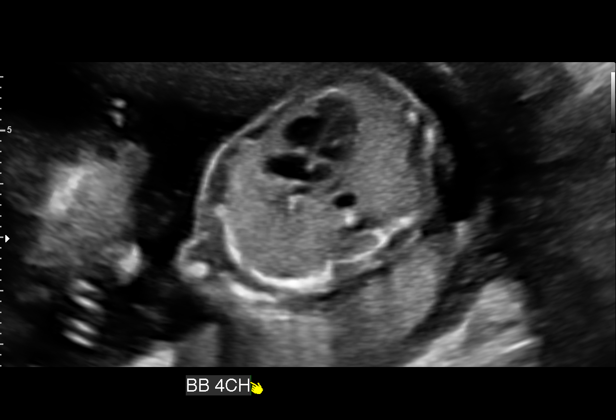

[14 of 28 positions shown; findings below may reference images not displayed]

2  US MFM OB DETAIL ADDL GEST           76811.02     BIOMARS IVGEN
     +14 WK
 ----------------------------------------------------------------------

 ----------------------------------------------------------------------
Indications

  Encounter for antenatal screening for
  malformations
  Twin pregnancy, Padam/Mancia, second trimester
  Negative Carrier Screen
  19 weeks gestation of pregnancy
 ----------------------------------------------------------------------
Vital Signs

 BMI:
Fetal Evaluation (Fetus A)

 Num Of Fetuses:         2
 Fetal Heart Rate(bpm):  148
 Cardiac Activity:       Observed
 Fetal Lie:              Maternal lower right side
 Presentation:           Cephalic
 Placenta:               Posterior Fundal
 Membrane Desc:      Dividing Membrane seen - Monochorionic

 Amniotic Fluid
 AFI FV:      Within normal limits

                             Largest Pocket(cm)

Biometry (Fetus A)
 BPD:      43.5  mm     G. Age:  19w 1d         58  %    CI:        76.22   %    70 - 86
                                                         FL/HC:      17.5   %    16.1 -
 HC:      157.9  mm     G. Age:  18w 5d         27  %    HC/AC:      1.10        1.09 -
 AC:      143.1  mm     G. Age:  19w 5d         68  %    FL/BPD:     63.7   %
 FL:       27.7  mm     G. Age:  18w 3d         25  %    FL/AC:      19.4   %    20 - 24
 HUM:      26.7  mm     G. Age:  18w 3d         35  %
 NFT:       3.9  mm

 Est. FW:     272  gm    0 lb 10 oz      49  %     FW Discordancy      0 \ 3 %
OB History

 Gravidity:    3         Term:   0        Prem:   0        SAB:   0
 TOP:          0       Ectopic:  2        Living: 0
Gestational Age (Fetus A)

 LMP:           19w 0d        Date:  10/18/18                 EDD:   07/25/19
 U/S Today:     19w 0d                                        EDD:   07/25/19
 Best:          19w 0d     Det. By:  LMP  (10/18/18)          EDD:   07/25/19
Anatomy (Fetus A)

 Cranium:               Appears normal         LVOT:                   Appears normal
 Cavum:                 Appears normal         Aortic Arch:            Appears normal
 Ventricles:            Appears normal         Ductal Arch:            Appears normal
 Choroid Plexus:        Appears normal         Diaphragm:              Appears normal
 Cerebellum:            Appears normal         Stomach:                Appears normal, left
                                                                       sided
 Posterior Fossa:       Appears normal         Abdomen:                Appears normal
 Nuchal Fold:           Appears normal         Abdominal Wall:         Appears nml (cord
                                                                       insert, abd wall)
 Face:                  Orbits appear          Cord Vessels:           Appears normal (3
                        normal; profile not                            vessel cord)
                        seen
 Lips:                  Appears normal         Kidneys:                Appear normal
 Palate:                Not well visualized    Bladder:                Appears normal
 Thoracic:              Appears normal         Spine:                  Appears normal
 Heart:                 Appears normal         Upper Extremities:      Appears normal
                        (4CH, axis, and
                        situs)
 RVOT:                  Appears normal         Lower Extremities:      Appears normal

 Other:  Male gender. Technically difficult due to fetal position.

Fetal Evaluation (Fetus B)

 Num Of Fetuses:         2
 Fetal Heart Rate(bpm):  143
 Cardiac Activity:       Observed
 Fetal Lie:              Maternal upper left side
 Presentation:           Cephalic
 Placenta:               Posterior Fundal
 Membrane Desc:      Dividing Membrane seen - Monochorionic

 Amniotic Fluid
 AFI FV:      Within normal limits

                             Largest Pocket(cm)

Biometry (Fetus B)

 BPD:      42.7  mm     G. Age:  18w 6d         47  %    CI:        76.56   %    70 - 86
                                                         FL/HC:      17.5   %    16.1 -
 HC:      154.6  mm     G. Age:  18w 3d         17  %    HC/AC:      1.09        1.09 -
 AC:      142.1  mm     G. Age:  19w 4d         65  %    FL/BPD:     63.5   %
 FL:       27.1  mm     G. Age:  18w 2d         19  %    FL/AC:      19.1   %    20 - 24
 HUM:      25.9  mm     G. Age:  18w 1d         26  %
 NFT:       2.9  mm

 Est. FW:     264  gm      0 lb 9 oz     40  %     FW Discordancy         3  %
Gestational Age (Fetus B)

 LMP:           19w 0d        Date:  10/18/18                 EDD:   07/25/19
 U/S Today:     18w 6d                                        EDD:   07/26/19
 Best:          19w 0d     Det. By:  LMP  (10/18/18)          EDD:   07/25/19
Anatomy (Fetus B)

 Cranium:               Appears normal         LVOT:                   Appears normal
 Cavum:                 Appears normal         Aortic Arch:            Not well visualized
 Ventricles:            Appears normal         Ductal Arch:            Appears normal
 Choroid Plexus:        Appears normal         Diaphragm:              Appears normal
 Cerebellum:            Appears normal         Stomach:                Appears normal, left
                                                                       sided
 Posterior Fossa:       Appears normal         Abdomen:                Appears normal
 Nuchal Fold:           Appears normal         Abdominal Wall:         Appears nml (cord
                                                                       insert, abd wall)
 Face:                  Profile appears        Cord Vessels:           Appears normal (3
                        normal                                         vessel cord)
 Lips:                  Not well visualized    Kidneys:                Appear normal
 Palate:                Not well visualized    Bladder:                Appears normal
 Thoracic:              Appears normal         Spine:                  Not well visualized
 Heart:                 Echogenic focus        Upper Extremities:      Appears normal
                        in LV
 RVOT:                  Appears normal         Lower Extremities:      Appears normal

 Other:  Male gender. Technically difficult due to fetal position.
Cervix Uterus Adnexa

 Cervix
 Length:            3.1  cm.
 Normal appearance by transabdominal scan.
Impression

 Monochorionic-diamniotic twin pregnancy.

 EDD was established by LMP date that was consistent with
 first trimester ultrasound.

 On cell free fetal DNA screening the risks of aneuploidies are
 not increased.  Your office has notified the lab to corrected for
 twin gestation and the results are pending.
 Obstetric history significant for 2 ectopic pregnancies.
 Patient reports no chronic medical conditions.

 Twin A: Lower fetus, maternal right, posterior placenta,
 cephalic presentation, male fetus.  Fetal growth is appropriate
 for gestational age.  Amniotic fluid is normal and good fetal
 activity seen.  No markers of aneuploidies or fetal structural
 defects are seen.

 Twin B: Upper fetus, maternal left, posterior placenta,
 cephalic presentation, male fetus.Fetal growth is appropriate
 for gestational age.  Amniotic fluid is normal and good fetal
 activity seen.  An echogenic intracardiac focus is seen.  No
 other markers of aneuploidies or fetal structural defects are
 seen.

 I discussed the possible complications of twin pregnancy
 including twin to twin transfusion syndrome (TTTS), selective
 fetal growth restriction and increased congenital anomalies
 that may not be detected by ultrasound.  She also has an
 increased risk for gestational diabetes and preeclampsia.  I
 discussed the benefit of low-dose aspirin prophylaxis and
 recommended that she take aspirin 81 mg daily till delivery.

 We performed fetal anatomy scan. An echogenic intracardiac
 focus is seen. No other makers of aneuploidies or fetal
 structural defects are seen. Fetal biometry is consistent with
 her previously-established dates. Amniotic fluid is normal and
 good fetal activity is seen.
 I informed the patient that given that she had Omaira Riddick for fetal
 aneuploidies on cell-free fetal DNA screening, finding of
 echogenic intracardiac focus in twin B should be considered
 a normal variant and that the risk of trisomy 21 is not
 increased. I also reassured that echogenic focus does not
 increase the risk of cardiac defects. I also informed her that
 only amniocentesis will give a defintive result on the fetal
 karyotype.
 Patient opted not to have amniocentesis.
Recommendations

 -TTTS surveillance every 2 weeks.
 -Fetal growth assessment in 4 weeks.
 -Completion of fetal anatomy in 4 weeks (spine, aortic arch,
 lips in twin B and profile in twin A).
                 Mbrouk, [place]

## 2020-10-04 IMAGING — US US MFM OB FOLLOW-UP
1 of 2 series · 12 of 28 positions shown · non-contrast
Comparison: none

[Series 1: us mfm ob follow-up · 12 of 44 slices shown]
[im 2/44]
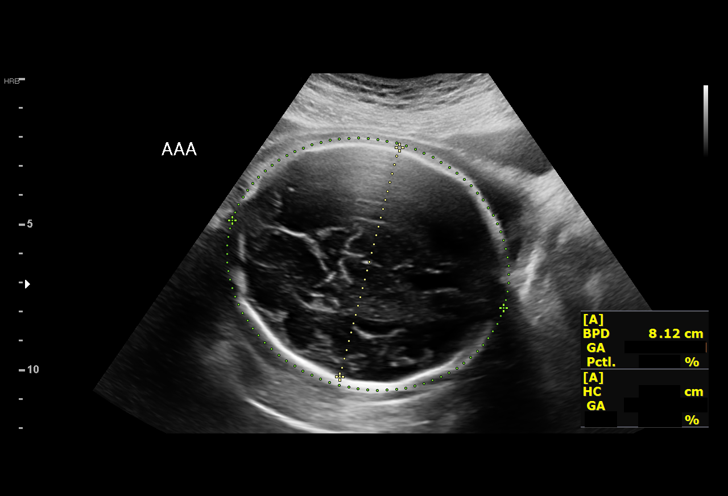
[im 5/44]
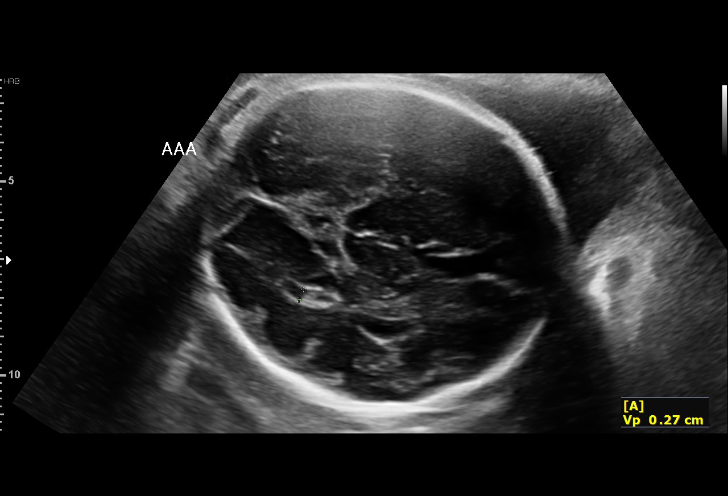
[im 9/44]
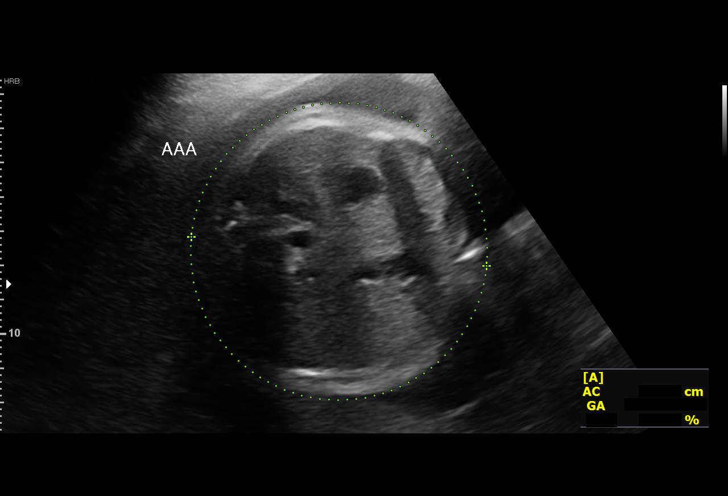
[im 14/44]
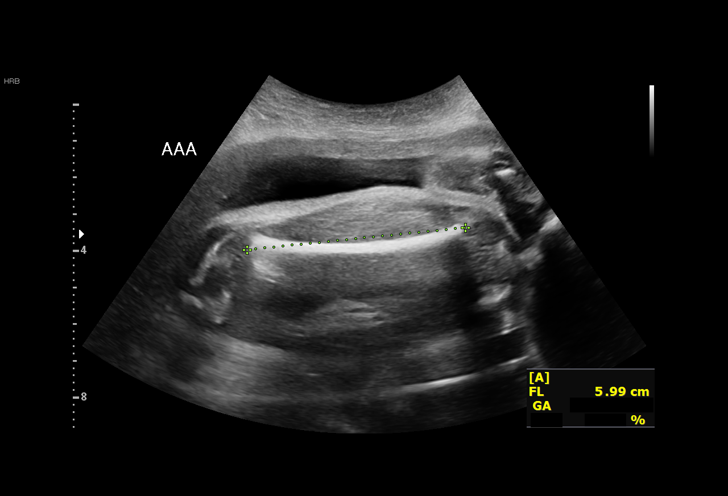
[im 17/44]
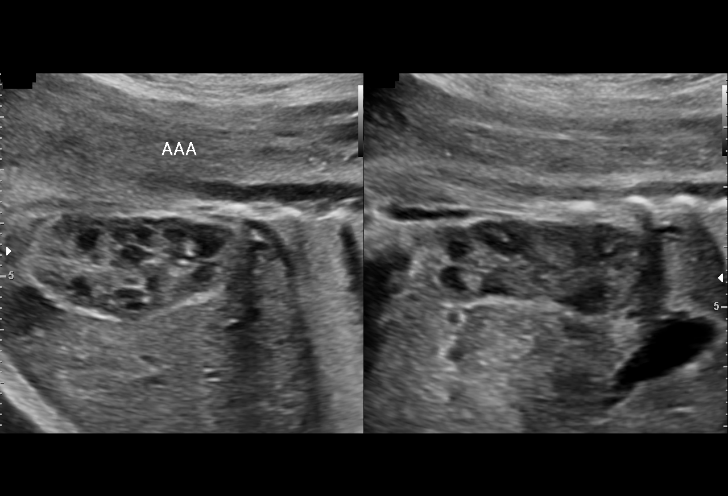
[im 20/44]
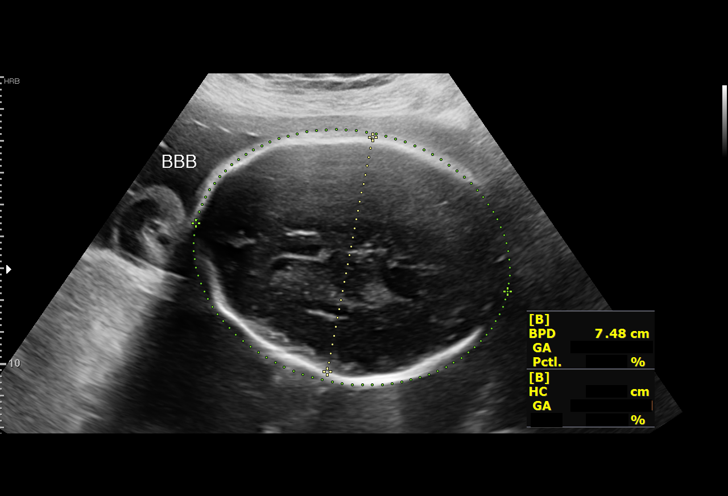
[im 25/44]
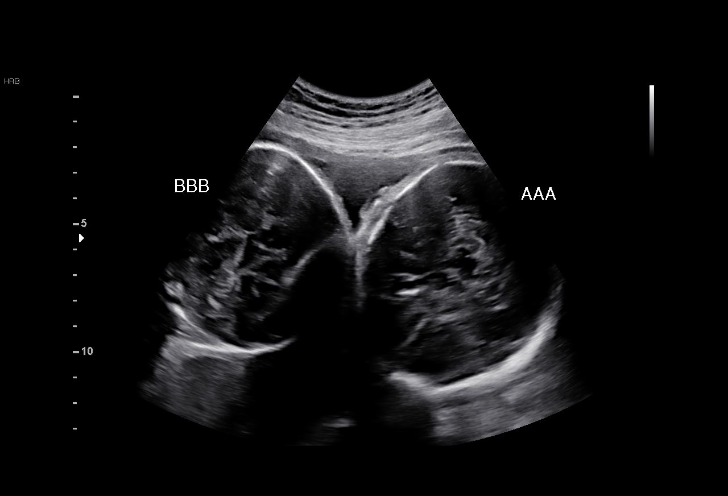
[im 29/44]
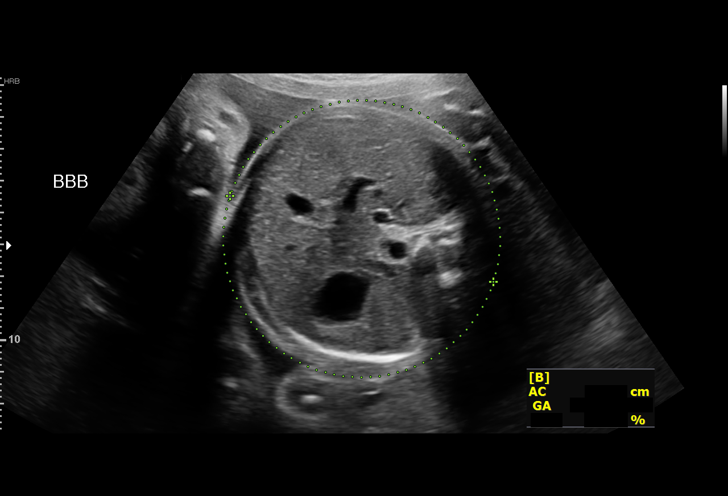
[im 32/44]
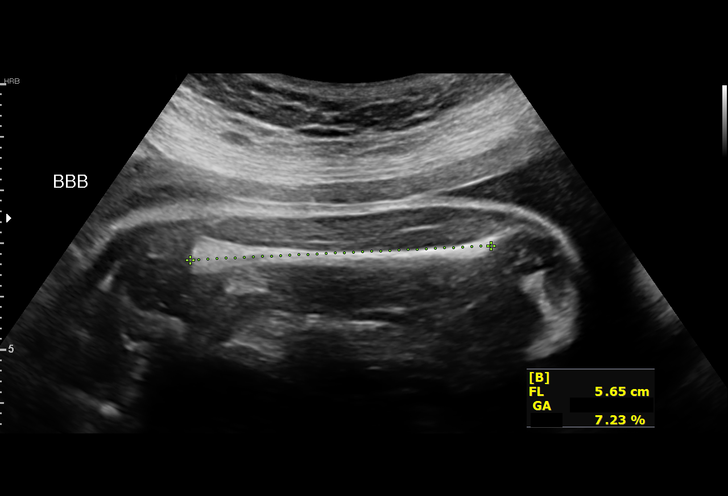
[im 37/44]
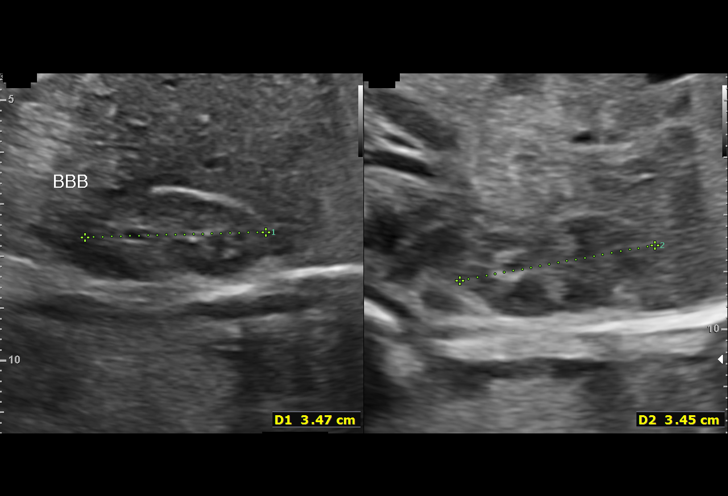
[im 40/44]
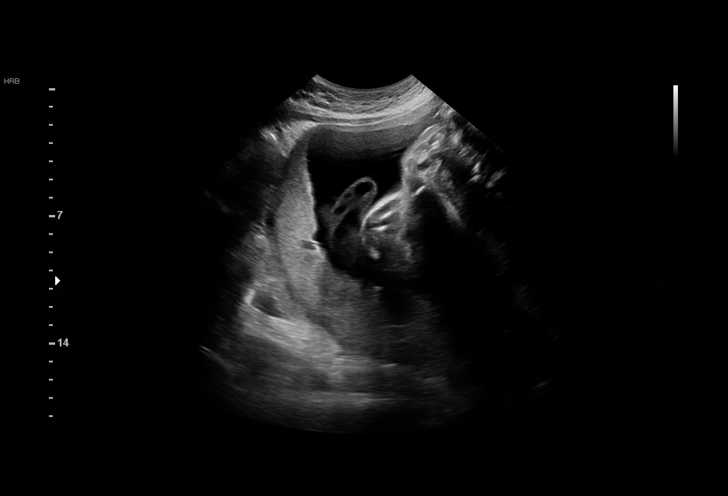
[im 44/44]
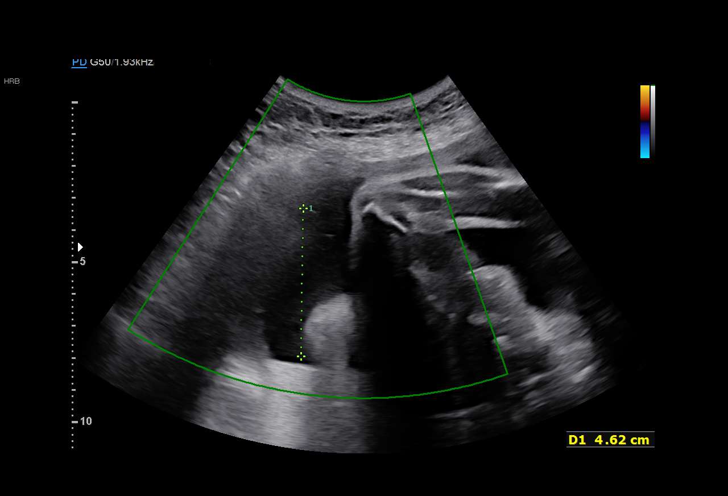

[12 of 28 positions shown; findings below may reference images not displayed]

GEST
 ----------------------------------------------------------------------

 ----------------------------------------------------------------------
Indications

  Twin pregnancy, Tiger/Pinder, second trimester
  Fetal abnormality - other known or
  suspected (Twin B - RVEIF)
  Encounter for other antenatal screening
  follow-up
  Negative Carrier Screen/ low risk NIPS
  31 weeks gestation of pregnancy
 ----------------------------------------------------------------------
Vital Signs

 BMI:
Fetal Evaluation (Fetus A)

 Num Of Fetuses:         2
 Fetal Heart Rate(bpm):  127
 Cardiac Activity:       Observed
 Fetal Lie:              Maternal right side
 Presentation:           Cephalic
 Placenta:               Posterior
 P. Cord Insertion:      Previously Visualized

 Amniotic Fluid
 AFI FV:      Within normal limits

                             Largest Pocket(cm)

Biometry (Fetus A)

 BPD:      81.3  mm     G. Age:  32w 5d         83  %    CI:         79.9   %    70 - 86
                                                         FL/HC:      21.0   %    19.3 -
 HC:      287.4  mm     G. Age:  31w 4d         25  %    HC/AC:      1.07        0.96 -
 AC:      269.1  mm     G. Age:  31w 0d         43  %    FL/BPD:     74.3   %    71 - 87
 FL:       60.4  mm     G. Age:  31w 3d         43  %    FL/AC:      22.4   %    20 - 24
 LV:        2.7  mm

 Est. FW:    4477  gm    3 lb 14 oz      44  %     FW Discordancy      0 \ 5 %
OB History

 Gravidity:    3         Term:   0        Prem:   0        SAB:   0
 TOP:          0       Ectopic:  2        Living: 0
Gestational Age (Fetus A)

 LMP:           31w 1d        Date:  10/18/18                 EDD:   07/25/19
 U/S Today:     31w 5d                                        EDD:   07/21/19
 Best:          31w 1d     Det. By:  LMP  (10/18/18)          EDD:   07/25/19
Anatomy (Fetus A)

 Cranium:               Appears normal         LVOT:                   Previously seen
 Cavum:                 Appears normal         Aortic Arch:            Previously seen
 Ventricles:            Appears normal         Ductal Arch:            Previously seen
 Choroid Plexus:        Previously seen        Diaphragm:              Appears normal
 Cerebellum:            Previously seen        Stomach:                Appears normal, left
                                                                       sided
 Posterior Fossa:       Previously seen        Abdomen:                Appears normal
 Nuchal Fold:           Previously seen        Abdominal Wall:         Previously seen
 Face:                  Orbits and profile     Cord Vessels:           Previously seen
                        previously seen
 Lips:                  Previously seen        Kidneys:                Appear normal
 Palate:                Previously seen        Bladder:                Appears normal
 Thoracic:              Appears normal         Spine:                  Previously seen
 Heart:                 Previously seen        Upper Extremities:      Previously seen
 RVOT:                  Previously seen        Lower Extremities:      Previously seen

Fetal Evaluation (Fetus B)

 Num Of Fetuses:         2
 Fetal Heart Rate(bpm):  135
 Cardiac Activity:       Observed
 Fetal Lie:              Maternal left side
 Presentation:           Cephalic
 Placenta:               Posterior
 P. Cord Insertion:      Previously Visualized

 Amniotic Fluid
 AFI FV:      Within normal limits

                             Largest Pocket(cm)

Biometry (Fetus B)
 BPD:      75.9  mm     G. Age:  30w 3d         20  %    CI:        72.34   %    70 - 86
                                                         FL/HC:      20.2   %    19.3 -
 HC:      283.9  mm     G. Age:  31w 1d         15  %    HC/AC:      1.04        0.96 -
 AC:      273.8  mm     G. Age:  31w 3d         56  %    FL/BPD:     75.6   %    71 - 87
 FL:       57.4  mm     G. Age:  30w 1d         12  %    FL/AC:      21.0   %    20 - 24
 LV:        3.7  mm

 Est. FW:    6775  gm    3 lb 11 oz      30  %     FW Discordancy         5  %
Gestational Age (Fetus B)

 LMP:           31w 1d        Date:  10/18/18                 EDD:   07/25/19
 U/S Today:     30w 6d                                        EDD:   07/27/19
 Best:          31w 1d     Det. By:  LMP  (10/18/18)          EDD:   07/25/19
Anatomy (Fetus B)

 Cranium:               Appears normal         LVOT:                   Previously seen
 Cavum:                 Appears normal         Aortic Arch:            Previously seen
 Ventricles:            Appears normal         Ductal Arch:            Previously seen
 Choroid Plexus:        Previously seen        Diaphragm:              Appears normal
 Cerebellum:            Previously seen        Stomach:                Appears normal, left
                                                                       sided
 Posterior Fossa:       Previously seen        Abdomen:                Appears normal
 Nuchal Fold:           Previously seen        Abdominal Wall:         Previously seen
 Face:                  Orbits and profile     Cord Vessels:           Previously seen
                        previously seen
 Lips:                  Previously seen        Kidneys:                Appear normal
 Palate:                Previously seen        Bladder:                Appears normal
 Thoracic:              Appears normal         Spine:                  Previously seen
 Heart:                 Previously seen        Upper Extremities:      Previously seen
 RVOT:                  Previously seen        Lower Extremities:      Previously seen
Cervix Uterus Adnexa

 Cervix
 Not visualized (advanced GA >57wks)
Impression

 Monochoronic diamniotic twin pregnancy
 Normal interval growth with a 5% growth discordance
 Good amniotic fluid, bladder and stomach

 No signs or symptoms of TTTS or TAPS

 Good fetal movement bilaterally
Recommendations

 Follow up TTTS/TAPS screening in 2 weeks
 Repeat growth in 4 weeks

## 2020-11-08 ENCOUNTER — Encounter: Payer: Self-pay | Admitting: Nurse Practitioner

## 2020-11-08 ENCOUNTER — Other Ambulatory Visit: Payer: Self-pay

## 2020-11-08 ENCOUNTER — Ambulatory Visit (INDEPENDENT_AMBULATORY_CARE_PROVIDER_SITE_OTHER): Payer: Medicaid Other | Admitting: Nurse Practitioner

## 2020-11-08 VITALS — BP 120/76 | HR 83 | Ht 61.0 in | Wt 170.0 lb

## 2020-11-08 DIAGNOSIS — Z01419 Encounter for gynecological examination (general) (routine) without abnormal findings: Secondary | ICD-10-CM

## 2020-11-08 DIAGNOSIS — Z113 Encounter for screening for infections with a predominantly sexual mode of transmission: Secondary | ICD-10-CM

## 2020-11-08 DIAGNOSIS — Z3046 Encounter for surveillance of implantable subdermal contraceptive: Secondary | ICD-10-CM

## 2020-11-08 NOTE — Progress Notes (Signed)
   Leah Gray 06/12/1995 [id number]   History:  25 y.o. [id number] presents for annual exam without GYN complaints. Nexplanon 08/2019. Normal pap history. 2019 ectopic pregnancy, 08/2018 ectopic pregnancy with right salpingectomy. Twin delivery by cesarean 06/22/2019. Mother diagnosed with breast cancer at age 70. Received 1 dose of Gardasil.   Gynecologic History Patient's last menstrual period was 10/15/2020 (approximate).   Contraception: Nexplanon Last Pap: 10/27/2018. Results were: normal   Past medical history, past surgical history, family history and social history were all reviewed and documented in the EPIC chart. 15 mo twin boys.   ROS:  A ROS was performed and pertinent positives and negatives are included.  Exam:  Vitals:   11/08/20 1607  BP: 120/76  Pulse: 83  SpO2: 99%  Weight: 170 lb (77.1 kg)  Height: 5\' 1"  (1.549 m)   Body mass index is 32.12 kg/m.  General appearance:  Normal Thyroid:  Symmetrical, normal in size, without palpable masses or nodularity. Respiratory  Auscultation:  Clear without wheezing or rhonchi Cardiovascular  Auscultation:  Regular rate, without rubs, murmurs or gallops  Edema/varicosities:  Not grossly evident Abdominal  Soft,nontender, without masses, guarding or rebound. C-section scar on mid lower abdomen  Liver/spleen:  No organomegaly noted  Hernia:  None appreciated  Skin  Inspection:  Grossly normal   Breasts: Examined lying and sitting.   Right: Without masses, retractions, discharge or axillary adenopathy.   Left: Without masses, retractions, discharge or axillary adenopathy. Gentitourinary   Inguinal/mons:  Normal without inguinal adenopathy  External genitalia:  Normal  BUS/Urethra/Skene's glands:  Normal  Vagina:  Normal  Cervix:  Normal  Uterus:  Normal in size, shape and contour.  Midline and mobile  Adnexa/parametria:     Rt: Without masses or tenderness.   Lt: Without masses or tenderness.  Anus and  perineum: Normal   Assessment/Plan:  25 y.o. G3 P0122 for annual exam.   Well female exam with routine gynecological exam - Education provided on SBEs, importance of preventative screenings, current guidelines, high calcium diet, regular exercise, and multivitamin daily.   Encounter for surveillance of implantable subdermal contraceptive - Nexplanon inserted 08/2019. She is aware this is good for 3 years. She has irregular light bleeding.   Screen for STD (sexually transmitted disease) - Plan: SURESWAB CT/NG/T. vaginalis  Screening for cervical cancer - Normal Pap history.  Will repeat at 3-year interval per guidelines.  Follow up in 1 year for annual      09/2019 [facility], 4:38 PM 11/08/2020

## 2020-11-09 LAB — SURESWAB CT/NG/T. VAGINALIS
C. trachomatis RNA, TMA: NOT DETECTED
N. gonorrhoeae RNA, TMA: NOT DETECTED
Trichomonas vaginalis RNA: NOT DETECTED

## 2021-03-13 DIAGNOSIS — Z113 Encounter for screening for infections with a predominantly sexual mode of transmission: Secondary | ICD-10-CM | POA: Diagnosis not present

## 2021-03-13 DIAGNOSIS — A6004 Herpesviral vulvovaginitis: Secondary | ICD-10-CM | POA: Diagnosis not present

## 2021-04-15 ENCOUNTER — Other Ambulatory Visit: Payer: Self-pay | Admitting: Family Medicine

## 2021-04-15 DIAGNOSIS — R638 Other symptoms and signs concerning food and fluid intake: Secondary | ICD-10-CM | POA: Diagnosis not present

## 2021-04-15 DIAGNOSIS — R5383 Other fatigue: Secondary | ICD-10-CM | POA: Diagnosis not present

## 2021-04-16 LAB — BASIC METABOLIC PANEL WITH GFR
BUN: 9 mg/dL (ref 7–25)
CO2: 23 mmol/L (ref 20–32)
Calcium: 9.5 mg/dL (ref 8.6–10.2)
Chloride: 108 mmol/L (ref 98–110)
Creat: 0.53 mg/dL (ref 0.50–0.96)
Glucose, Bld: 97 mg/dL (ref 65–139)
Potassium: 4.3 mmol/L (ref 3.5–5.3)
Sodium: 140 mmol/L (ref 135–146)
eGFR: 130 mL/min/{1.73_m2} (ref >=60)

## 2021-04-16 LAB — CBC WITH DIFFERENTIAL/PLATELET
Absolute Monocytes: 295 cells/uL (ref 200–950)
Basophils Absolute: 50 cells/uL (ref 0–200)
Basophils Relative: 1 %
Eosinophils Absolute: 200 cells/uL (ref 15–500)
Eosinophils Relative: 4 %
HCT: 42.9 % (ref 35.0–45.0)
Hemoglobin: 14.7 g/dL (ref 11.7–15.5)
Lymphs Abs: 1960 cells/uL (ref 850–3900)
MCH: 32.3 pg (ref 27.0–33.0)
MCHC: 34.3 g/dL (ref 32.0–36.0)
MCV: 94.3 fL (ref 80.0–100.0)
MPV: 10.9 fL (ref 7.5–12.5)
Monocytes Relative: 5.9 %
Neutro Abs: 2495 cells/uL (ref 1500–7800)
Neutrophils Relative %: 49.9 %
Platelets: 310 10*3/uL (ref 140–400)
RBC: 4.55 10*6/uL (ref 3.80–5.10)
RDW: 12.2 % (ref 11.0–15.0)
Total Lymphocyte: 39.2 %
WBC: 5 10*3/uL (ref 3.8–10.8)

## 2021-04-16 LAB — SPECIMEN COMPROMISED

## 2021-04-16 LAB — HEMOGLOBIN A1C W/OUT EAG: Hgb A1c MFr Bld: 5.2 % of total Hgb (ref <5.7)

## 2021-11-11 ENCOUNTER — Other Ambulatory Visit (HOSPITAL_COMMUNITY)
Admission: RE | Admit: 2021-11-11 | Discharge: 2021-11-11 | Disposition: A | Discharge status: Home / Self Care | Payer: Medicaid Other | Source: Ambulatory Visit | Attending: Nurse Practitioner | Admitting: Nurse Practitioner

## 2021-11-11 ENCOUNTER — Ambulatory Visit (INDEPENDENT_AMBULATORY_CARE_PROVIDER_SITE_OTHER): Payer: Medicaid Other | Admitting: Nurse Practitioner

## 2021-11-11 ENCOUNTER — Encounter: Payer: Self-pay | Admitting: Nurse Practitioner

## 2021-11-11 VITALS — BP 118/80 | Ht 61.0 in | Wt 142.0 lb

## 2021-11-11 DIAGNOSIS — Z3046 Encounter for surveillance of implantable subdermal contraceptive: Secondary | ICD-10-CM

## 2021-11-11 DIAGNOSIS — Z01419 Encounter for gynecological examination (general) (routine) without abnormal findings: Secondary | ICD-10-CM | POA: Insufficient documentation

## 2021-11-11 DIAGNOSIS — Z113 Encounter for screening for infections with a predominantly sexual mode of transmission: Secondary | ICD-10-CM

## 2021-11-11 NOTE — Progress Notes (Addendum)
   Leah Gray 1995/08/17 [id number]   History:  26 y.o. [id number] presents for annual exam without GYN complaints. Nexplanon 08/2019. Normal pap history. H/O ectopic pregnancy 2019/2020 with right salpingectomy. Twin delivery by cesarean 06/22/2019.  Received 1 dose of Gardasil. Down 30 pounds with diet and exercise.   Gynecologic History Patient's last menstrual period was 10/20/2021. Period Pattern: (!) Irregular Menstrual Flow: Moderate Menstrual Control: Maxi pad, Tampon Dysmenorrhea: (!) Mild Dysmenorrhea Symptoms: Cramping Contraception/Family planning: Nexplanon Sexually active: Yes  Health Maintenance Last Pap: 10/27/2018. Results were: Normal Last mammogram: Not indicated Last colonoscopy: Not indicated Last Dexa: Not indicated  Past medical history, past surgical history, family history and social history were all reviewed and documented in the EPIC chart. 28 yo twin boys. Mother diagnosed with breast cancer at age 25.  ROS:  A ROS was performed and pertinent positives and negatives are included.  Exam:  Vitals:   11/11/21 1535  BP: 118/80  Weight: 142 lb (64.4 kg)  Height: 5\' 1"  (1.549 m)    Body mass index is 26.83 kg/m.  General appearance:  Normal Thyroid:  Symmetrical, normal in size, without palpable masses or nodularity. Respiratory  Auscultation:  Clear without wheezing or rhonchi Cardiovascular  Auscultation:  Regular rate, without rubs, murmurs or gallops  Edema/varicosities:  Not grossly evident Abdominal  Soft,nontender, without masses, guarding or rebound. C-section scar on mid lower abdomen  Liver/spleen:  No organomegaly noted  Hernia:  None appreciated  Skin  Inspection:  Grossly normal   Breasts: Examined lying and sitting.   Right: Without masses, retractions, discharge or axillary adenopathy.   Left: Without masses, retractions, discharge or axillary adenopathy. Genitourinary   Inguinal/mons:  Normal without inguinal  adenopathy  External genitalia:  Normal appearing vulva with no masses, tenderness, or lesions  BUS/Urethra/Skene's glands:  Normal  Vagina:  Normal appearing with normal color and discharge, no lesions  Cervix:  Normal appearing without discharge or lesions  Uterus:  Normal in size, shape and contour.  Midline and mobile, nontender  Adnexa/parametria:     Rt: Normal in size, without masses or tenderness.   Lt: Normal in size, without masses or tenderness.  Anus and perineum: Normal  Digital rectal exam: Not indicated  Patient informed chaperone available to be present for breast and pelvic exam. Patient has requested no chaperone to be present. Patient has been advised what will be completed during breast and pelvic exam.   Assessment/Plan:  26 y.o. G3 P0122 for annual exam.   Well female exam with routine gynecological exam - Plan: Cytology - PAP( Amanda) Education provided on SBEs, importance of preventative screenings, current guidelines, high calcium diet, regular exercise, and multivitamin daily. Congratulated on weight loss.   Encounter for surveillance of implantable subdermal contraceptive - Nexplanon inserted 08/2019. She has irregular light bleeding. Aware it is due to be changed next year. Unsure if she wants another one versus different method but will call beforehand to let 09/2019 know.   Screen for STD (sexually transmitted disease) - Plan: Cytology - PAP( Drain). Gonorrhea, chlamydia, trich added to pap.   Screening for cervical cancer - Normal Pap history.  Pap today.   Screening for breast cancer - Mother diagnosed with breast cancer at age 66. Does not think she had genetic testing done. Recommend starting screenings at age 6.   Follow up in 1 year for annual.      [facility], 3:56 PM 11/11/2021

## 2021-11-13 LAB — CYTOLOGY - PAP
Adequacy: ABSENT
Chlamydia: NEGATIVE
Comment: NEGATIVE
Comment: NEGATIVE
Comment: NORMAL
Diagnosis: NEGATIVE
Neisseria Gonorrhea: NEGATIVE
Trichomonas: NEGATIVE

## 2021-11-20 ENCOUNTER — Other Ambulatory Visit: Payer: Self-pay | Admitting: Nurse Practitioner

## 2021-11-20 DIAGNOSIS — N760 Acute vaginitis: Secondary | ICD-10-CM

## 2021-11-20 DIAGNOSIS — B9689 Other specified bacterial agents as the cause of diseases classified elsewhere: Secondary | ICD-10-CM

## 2021-11-20 MED ORDER — METRONIDAZOLE 500 MG PO TABS: 500.0000 mg | ORAL_TABLET | Freq: Two times a day (BID) | ORAL | 0 refills | Status: DC

## 2022-11-13 ENCOUNTER — Encounter: Payer: Self-pay | Admitting: Nurse Practitioner

## 2022-11-13 ENCOUNTER — Ambulatory Visit: Payer: Medicaid Other | Admitting: Nurse Practitioner

## 2022-11-13 VITALS — BP 114/74 | HR 87 | Ht 61.0 in | Wt 130.0 lb

## 2022-11-13 DIAGNOSIS — Z01419 Encounter for gynecological examination (general) (routine) without abnormal findings: Secondary | ICD-10-CM | POA: Diagnosis not present

## 2022-11-13 DIAGNOSIS — Z803 Family history of malignant neoplasm of breast: Secondary | ICD-10-CM

## 2022-11-13 DIAGNOSIS — Z3046 Encounter for surveillance of implantable subdermal contraceptive: Secondary | ICD-10-CM | POA: Diagnosis not present

## 2022-11-13 NOTE — Progress Notes (Signed)
   Leah Gray Oct 04, 1995 [id number]   History:  27 y.o. [id number] presents for annual exam without GYN complaints. Nexplanon 08/2019, has appt 8/8 for exchange. Normal pap history. H/O ectopic pregnancy 2019/2020 with right salpingectomy. Twin delivery by cesarean 06/22/2019.  Received 1 dose of Gardasil. Down 40 pounds with diet and exercise.   Gynecologic History Patient's last menstrual period was 10/21/2022 (exact date). Period Duration (Days): 5 Period Pattern: Regular Menstrual Flow:  (varies) Menstrual Control: Tampon Dysmenorrhea: None Contraception/Family planning: Nexplanon Sexually active: Yes, declines STD screening  Health Maintenance Last Pap: 11/11/2021. Results were: Normal Last mammogram: Not indicated Last colonoscopy: Not indicated Last Dexa: Not indicated  Past medical history, past surgical history, family history and social history were all reviewed and documented in the EPIC chart. 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 3 yo twin boys, autistic. Mother diagnosed with breast cancer at age 71.  ROS:  A ROS was performed and pertinent positives and negatives are included.  Exam:  Vitals:   11/13/22 1138  BP: 114/74  Pulse: 87  SpO2: 98%  Weight: 130 lb (59 kg)  Height: 5\' 1"  (1.549 m)     Body mass index is 24.56 kg/m.  General appearance:  Normal Thyroid:  Symmetrical, normal in size, without palpable masses or nodularity. Respiratory  Auscultation:  Clear without wheezing or rhonchi Cardiovascular  Auscultation:  Regular rate, without rubs, murmurs or gallops  Edema/varicosities:  Not grossly evident Abdominal  Soft,nontender, without masses, guarding or rebound. C-section scar on mid lower abdomen  Liver/spleen:  No organomegaly noted  Hernia:  None appreciated  Skin  Inspection:  Grossly normal   Breasts: Examined lying and sitting.   Right: Without masses, retractions, discharge or axillary adenopathy.   Left: Without masses, retractions, discharge or axillary  adenopathy. Genitourinary   Inguinal/mons:  Normal without inguinal adenopathy  External genitalia:  Normal appearing vulva with no masses, tenderness, or lesions  BUS/Urethra/Skene's glands:  Normal  Vagina:  Normal appearing with normal color and discharge, no lesions  Cervix:  Normal appearing without discharge or lesions  Uterus:  Normal in size, shape and contour.  Midline and mobile, nontender  Adnexa/parametria:     Rt: Normal in size, without masses or tenderness.   Lt: Normal in size, without masses or tenderness.  Anus and perineum: Normal  Digital rectal exam: Not indicated  Patient informed chaperone available to be present for breast and pelvic exam. Patient has requested no chaperone to be present. Patient has been advised what will be completed during breast and pelvic exam.   Assessment/Plan:  27 y.o. G3 P0122 for annual exam.   Well female exam with routine gynecological exam - Plan: CBC with Differential/Platelet, Comprehensive metabolic panel, Lipid panelEducation provided on SBEs, importance of preventative screenings, current guidelines, high calcium diet, regular exercise, and multivitamin daily. Will return for fasting labs.   Encounter for surveillance of implantable subdermal contraceptive - Nexplanon inserted 08/2019. Exchange scheduled 8/8.   Family history of breast cancer in mother - mother diagnosed at age 51. Does not think she had genetic testing done. Recommend patient or mother have genetic screening. Will start breast cancer screenings at age 50.   Screening for cervical cancer - Normal Pap history.  Will repeat at 3-year interval per guidelines.   Follow up in 1 year for annual or sooner if needed.       Olivia Mackie [facility], 12:15 PM 11/13/2022

## 2022-11-17 ENCOUNTER — Other Ambulatory Visit: Payer: Medicaid Other

## 2022-11-17 DIAGNOSIS — Z01419 Encounter for gynecological examination (general) (routine) without abnormal findings: Secondary | ICD-10-CM | POA: Diagnosis not present

## 2022-11-17 LAB — CBC WITH DIFFERENTIAL/PLATELET
Absolute Monocytes: 330 cells/uL (ref 200–950)
Basophils Relative: 1 %
Eosinophils Relative: 3.8 %
Lymphs Abs: 2328 cells/uL (ref 850–3900)
MCHC: 32.9 g/dL (ref 32.0–36.0)
MPV: 10.4 fL (ref 7.5–12.5)
Platelets: 279 10*3/uL (ref 140–400)
Total Lymphocyte: 38.8 %
WBC: 6 10*3/uL (ref 3.8–10.8)

## 2022-11-18 LAB — CBC WITH DIFFERENTIAL/PLATELET
Basophils Absolute: 60 cells/uL (ref 0–200)
Eosinophils Absolute: 228 cells/uL (ref 15–500)
HCT: 42.2 % (ref 35.0–45.0)
Hemoglobin: 13.9 g/dL (ref 11.7–15.5)
MCH: 32 pg (ref 27.0–33.0)
MCV: 97.2 fL (ref 80.0–100.0)
Monocytes Relative: 5.5 %
Neutro Abs: 3054 cells/uL (ref 1500–7800)
Neutrophils Relative %: 50.9 %
RBC: 4.34 10*6/uL (ref 3.80–5.10)
RDW: 12.2 % (ref 11.0–15.0)

## 2022-11-18 LAB — COMPREHENSIVE METABOLIC PANEL
AG Ratio: 1.9 (calc) (ref 1.0–2.5)
ALT: 16 U/L (ref 6–29)
AST: 17 U/L (ref 10–30)
Albumin: 4.3 g/dL (ref 3.6–5.1)
Alkaline phosphatase (APISO): 58 U/L (ref 31–125)
BUN: 15 mg/dL (ref 7–25)
CO2: 28 mmol/L (ref 20–32)
Calcium: 9.4 mg/dL (ref 8.6–10.2)
Chloride: 104 mmol/L (ref 98–110)
Creat: 0.6 mg/dL (ref 0.50–0.96)
Globulin: 2.3 g/dL (calc) (ref 1.9–3.7)
Glucose, Bld: 95 mg/dL (ref 65–99)
Potassium: 4.4 mmol/L (ref 3.5–5.3)
Sodium: 138 mmol/L (ref 135–146)
Total Bilirubin: 0.6 mg/dL (ref 0.2–1.2)
Total Protein: 6.6 g/dL (ref 6.1–8.1)

## 2022-11-18 LAB — LIPID PANEL
Cholesterol: 140 mg/dL (ref <200)
HDL: 50 mg/dL (ref >=50)
LDL Cholesterol (Calc): 74 mg/dL (calc)
Non-HDL Cholesterol (Calc): 90 mg/dL (calc) (ref <130)
Total CHOL/HDL Ratio: 2.8 (calc) (ref <5.0)
Triglycerides: 76 mg/dL (ref <150)

## 2022-12-04 ENCOUNTER — Encounter: Payer: Self-pay | Admitting: Obstetrics and Gynecology

## 2022-12-04 ENCOUNTER — Ambulatory Visit (INDEPENDENT_AMBULATORY_CARE_PROVIDER_SITE_OTHER): Payer: Medicaid Other | Admitting: Obstetrics and Gynecology

## 2022-12-04 VITALS — BP 110/79 | HR 58 | Wt 127.0 lb

## 2022-12-04 DIAGNOSIS — Z3046 Encounter for surveillance of implantable subdermal contraceptive: Secondary | ICD-10-CM

## 2022-12-04 MED ORDER — ETONOGESTREL 68 MG ~~LOC~~ IMPL
68.0000 mg | DRUG_IMPLANT | Freq: Once | SUBCUTANEOUS | Status: AC
Start: 2022-12-04 — End: 2022-12-04
  Administered 2022-12-04: 68 mg via SUBCUTANEOUS

## 2022-12-04 NOTE — Patient Instructions (Signed)
Etonogestrel Implant What is this medication? ETONOGESTREL (et oh noe JES trel) prevents ovulation and pregnancy. It belongs to a group of medications called contraceptives. This medication is a progestin hormone. This medicine may be used for other purposes; ask your health care provider or pharmacist if you have questions. COMMON BRAND NAME(S): Implanon, Nexplanon What should I tell my care team before I take this medication? They need to know if you have any of these conditions: Abnormal vaginal bleeding Blood clots Blood vessel disease Breast, cervical, endometrial, ovarian, liver, or uterine cancer Diabetes Gallbladder disease Heart disease or recent heart attack High blood pressure High cholesterol or triglycerides Kidney disease Liver disease Migraine headaches Seizures Stroke Tobacco use An unusual or allergic reaction to etonogestrel, other medications, foods, dyes, or preservatives Pregnant or trying to get pregnant Breastfeeding How should I use this medication? This device is inserted just under the skin on the inner side of your upper arm by your care team. Talk to your care team about the use of this medication in children. Special care may be needed. Overdosage: If you think you have taken too much of this medicine contact a poison control center or emergency room at once. NOTE: This medicine is only for you. Do not share this medicine with others. What if I miss a dose? This does not apply. What may interact with this medication? Do not take this medication with any of the following: Amprenavir Fosamprenavir This medication may also interact with the following: Acitretin Aprepitant Armodafinil Bexarotene Bosentan Carbamazepine Certain antivirals for HIV or hepatitis Certain medications for fungal infections, such as fluconazole, ketoconazole, itraconazole, or  voriconazole Cyclosporine Felbamate Griseofulvin Lamotrigine Modafinil Oxcarbazepine Phenobarbital Phenytoin Primidone Rifabutin Rifampin Rifapentine St. John's wort Topiramate This list may not describe all possible interactions. Give your health care provider a list of all the medicines, herbs, non-prescription drugs, or dietary supplements you use. Also tell them if you smoke, drink alcohol, or use illegal drugs. Some items may interact with your medicine. What should I watch for while using this medication? Visit your care team for regular checks on your progress. Using this medication does not protect you or your partner against HIV or other sexually transmitted infections (STIs). You should be able to feel the implant by pressing your fingertips over the skin where it was inserted. Contact your care team if you cannot feel the implant, and use a non-hormonal birth control method (such as condoms) until your care team confirms that the implant is in place. Contact your care team if you think that the implant may have broken or become bent while in your arm. You will receive a user card from your care team after the implant is inserted. The card is a record of the location of the implant in your upper arm and when it should be removed. Keep this card with your health records. What side effects may I notice from receiving this medication? Side effects that you should report to your care team as soon as possible: Allergic reactions--skin rash, itching, hives, swelling of the face, lips, tongue, or throat Blood clot--pain, swelling, or warmth in the leg, shortness of breath, chest pain Gallbladder problems--severe stomach pain, nausea, vomiting, fever Increase in blood pressure Liver injury--right upper belly pain, loss of appetite, nausea, light-colored stool, dark yellow or brown urine, yellowing skin or eyes, unusual weakness or fatigue New or worsening migraines or headaches Pain,  redness, or irritation at injection site Stroke--sudden numbness or weakness of the face, arm,  or leg, trouble speaking, confusion, trouble walking, loss of balance or coordination, dizziness, severe headache, change in vision Unusual vaginal discharge, itching, or odor Worsening mood, feelings of depression Side effects that usually do not require medical attention (report to your care team if they continue or are bothersome): Breast pain or tenderness Dark patches of skin on the face or other sun-exposed areas Irregular menstrual cycles or spotting Nausea Weight gain This list may not describe all possible side effects. Call your doctor for medical advice about side effects. You may report side effects to FDA at 1-800-FDA-1088. Where should I keep my medication? This medication is given in a hospital or clinic and will not be stored at home. NOTE: This sheet is a summary. It may not cover all possible information. If you have questions about this medicine, talk to your doctor, pharmacist, or health care provider.  2024 Elsevier/Gold Standard (2021-11-19 00:00:00)

## 2022-12-04 NOTE — Progress Notes (Signed)
    GYNECOLOGY VISIT  Patient name: Leah Gray MRN [id number]  Date of birth: Oct 19, 1995 Chief Complaint:   No chief complaint on file.   History:  Leah Gray is a 27 y.o. [phone] being seen today for nexplanon removal and insertion. No issues. Device in left arm and would like exchange.    The following portions of the patient's history were reviewed and updated as appropriate: allergies, current medications, past family history, past medical history, past social history, past surgical history and problem list.   Health Maintenance:   Last pap     Component Value Date/Time   DIAGPAP  11/11/2021 1557    - Negative for intraepithelial lesion or malignancy (NILM)   ADEQPAP  11/11/2021 1557    Satisfactory for evaluation; transformation zone component ABSENT.    Review of Systems:  Pertinent items are noted in HPI. Comprehensive review of systems was otherwise negative.   Objective:  Physical Exam BP 110/79   Pulse (!) 58   Wt 127 lb (57.6 kg)   LMP 12/01/2022 (Exact Date) Comment: nexplanon inserted 5/21  Breastfeeding No   BMI 24.00 kg/m    Nexplanon Insertion Procedure Patient identified, informed consent performed, consent signed.   Patient does understand that irregular bleeding is a very common side effect of this medication. She was advised to have backup contraception for one week after placement. Pregnancy test in clinic today was negative.  Appropriate time out taken.  Patient's left arm was prepped and draped in the usual sterile fashion. The area of insertion was noted on the left arm.  Patient was prepped with alcohol swab and then injected with 3 ml of 1% lidocaine.  She was prepped with betadine, Nexplanon removed from packaging,  Device confirmed in needle, then inserted full length of needle and withdrawn per handbook instructions. Nexplanon was able to palpated in the patient's arm; patient  palpated the insert herself. There was minimal blood loss.  Patient  insertion site covered with guaze and a pressure bandage to reduce any bruising.  The patient tolerated the procedure well and was given post procedure instructions.       Assessment & Plan:   1. Encounter for removal and reinsertion of Nexplanon Now s/p uncomplicated nexplanon removal and insertion, due for replacement in 3 years.  - etonogestrel (NEXPLANON) implant 68 mg  Routine preventative health maintenance measures emphasized.  Lorriane Shire, MD [facility] for [facility], [facility]

## 2022-12-04 NOTE — Progress Notes (Signed)
Patient here for Nexplanon removal and insertion of new one.  She has no questions or concerns

## 2023-11-17 ENCOUNTER — Encounter: Payer: Self-pay | Admitting: Nurse Practitioner

## 2023-11-17 ENCOUNTER — Ambulatory Visit (INDEPENDENT_AMBULATORY_CARE_PROVIDER_SITE_OTHER): Payer: Medicaid Other | Admitting: Nurse Practitioner

## 2023-11-17 VITALS — BP 120/74 | HR 71 | Ht 61.0 in | Wt 148.0 lb

## 2023-11-17 DIAGNOSIS — Z3046 Encounter for surveillance of implantable subdermal contraceptive: Secondary | ICD-10-CM | POA: Diagnosis not present

## 2023-11-17 DIAGNOSIS — Z1331 Encounter for screening for depression: Secondary | ICD-10-CM | POA: Diagnosis not present

## 2023-11-17 DIAGNOSIS — Z01419 Encounter for gynecological examination (general) (routine) without abnormal findings: Secondary | ICD-10-CM | POA: Diagnosis not present

## 2023-11-17 DIAGNOSIS — Z803 Family history of malignant neoplasm of breast: Secondary | ICD-10-CM | POA: Diagnosis not present

## 2023-11-17 LAB — CBC WITH DIFFERENTIAL/PLATELET
Absolute Lymphocytes: 1913 {cells}/uL (ref 850–3900)
Absolute Monocytes: 318 {cells}/uL (ref 200–950)
Basophils Absolute: 58 {cells}/uL (ref 0–200)
Basophils Relative: 1.1 %
Eosinophils Absolute: 148 {cells}/uL (ref 15–500)
Eosinophils Relative: 2.8 %
HCT: 39.8 % (ref 35.0–45.0)
Hemoglobin: 12.9 g/dL (ref 11.7–15.5)
MCH: 32 pg (ref 27.0–33.0)
MCHC: 32.4 g/dL (ref 32.0–36.0)
MCV: 98.8 fL (ref 80.0–100.0)
MPV: 10.5 fL (ref 7.5–12.5)
Monocytes Relative: 6 %
Neutro Abs: 2862 {cells}/uL (ref 1500–7800)
Neutrophils Relative %: 54 %
Platelets: 237 Thousand/uL (ref 140–400)
RBC: 4.03 Million/uL (ref 3.80–5.10)
RDW: 11.9 % (ref 11.0–15.0)
Total Lymphocyte: 36.1 %
WBC: 5.3 Thousand/uL (ref 3.8–10.8)

## 2023-11-17 LAB — COMPREHENSIVE METABOLIC PANEL WITH GFR
AG Ratio: 1.9 (calc) (ref 1.0–2.5)
ALT: 13 U/L (ref 6–29)
AST: 15 U/L (ref 10–30)
Albumin: 4.2 g/dL (ref 3.6–5.1)
Alkaline phosphatase (APISO): 63 U/L (ref 31–125)
BUN: 13 mg/dL (ref 7–25)
CO2: 25 mmol/L (ref 20–32)
Calcium: 8.9 mg/dL (ref 8.6–10.2)
Chloride: 106 mmol/L (ref 98–110)
Creat: 0.62 mg/dL (ref 0.50–0.96)
Globulin: 2.2 g/dL (ref 1.9–3.7)
Glucose, Bld: 75 mg/dL (ref 65–99)
Potassium: 4 mmol/L (ref 3.5–5.3)
Sodium: 138 mmol/L (ref 135–146)
Total Bilirubin: 0.5 mg/dL (ref 0.2–1.2)
Total Protein: 6.4 g/dL (ref 6.1–8.1)
eGFR: 124 mL/min/1.73m2 (ref >=60)

## 2023-11-17 NOTE — Progress Notes (Signed)
 Leah Gray 01-06-1996 [id number]   History:  28 y.o. [id number] presents for annual exam without GYN complaints. Nexplanon  11/2022. Normal pap history. H/O ectopic pregnancy 2019/2020 with right salpingectomy. Twin delivery by cesarean 06/22/2019.  Received 1 dose of Gardasil.   Gynecologic History Patient's last menstrual period was 10/21/2023 (exact date). Period Cycle (Days): 25 Period Duration (Days): 5-8 Period Pattern: Regular Menstrual Flow:  (Varies) Menstrual Control: Tampon Dysmenorrhea: None Contraception/Family planning: Nexplanon  Sexually active: Yes  Health Maintenance Last Pap: 11/11/2021. Results were: Normal Last mammogram: Not indicated Last colonoscopy: Not indicated Last Dexa: Not indicated     11/17/2023    9:15 AM  Depression screen PHQ 2/9  Decreased Interest 0  Down, Depressed, Hopeless 0  PHQ - 2 Score 0     Past medical history, past surgical history, family history and social history were all reviewed and documented in the EPIC chart. 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 73 yo twin boys, autistic. Mother diagnosed with breast cancer at age 24.  ROS:  A ROS was performed and pertinent positives and negatives are included.  Exam:  Vitals:   11/17/23 0906  BP: 120/74  Pulse: 71  SpO2: 99%  Weight: 148 lb (67.1 kg)  Height: 5' 1 (1.549 m)      Body mass index is 27.96 kg/m.  General appearance:  Normal Thyroid:  Symmetrical, normal in size, without palpable masses or nodularity. Respiratory  Auscultation:  Clear without wheezing or rhonchi Cardiovascular  Auscultation:  Regular rate, without rubs, murmurs or gallops  Edema/varicosities:  Not grossly evident Abdominal  Soft,nontender, without masses, guarding or rebound. C-section scar on mid lower abdomen  Liver/spleen:  No organomegaly noted  Hernia:  None appreciated  Skin  Inspection:  Grossly normal   Breasts: Examined lying and sitting.   Right: Without masses, retractions, discharge or axillary  adenopathy.   Left: Without masses, retractions, discharge or axillary adenopathy. Pelvic: External genitalia:  no lesions              Urethra:  normal appearing urethra with no masses, tenderness or lesions              Bartholins and Skenes: normal                 Vagina: normal appearing vagina with normal color and discharge, no lesions              Cervix: no lesions Bimanual Exam:  Uterus:  no masses or tenderness              Adnexa: no mass, fullness, tenderness              Rectovaginal: Deferred              Anus:  normal, no lesions  Patient informed chaperone available to be present for breast and pelvic exam. Patient has requested no chaperone to be present. Patient has been advised what will be completed during breast and pelvic exam.   Assessment/Plan:  28 y.o. G3 [id number] for annual exam.   Well female exam with routine gynecological exam - Plan: CBC with Differential/Platelet, Comprehensive metabolic panel. Education provided on SBEs, importance of preventative screenings, current guidelines, high calcium diet, regular exercise, and multivitamin daily.   Encounter for surveillance of implantable subdermal contraceptive - Nexplanon  inserted 11/2022.  Family history of breast cancer in mother - mother diagnosed at age 54. Does not think she had genetic testing done. Recommend patient or mother have genetic screening. Will start  breast cancer screenings at age 66.   Screening for cervical cancer - Normal Pap history.  Will repeat at 3-year interval per guidelines.   Return in about 1 year (around 11/16/2024) for Annual.      Leah Gray [facility], 9:31 AM 11/17/2023

## 2023-11-17 NOTE — Patient Instructions (Signed)

## 2023-11-18 ENCOUNTER — Ambulatory Visit: Payer: Self-pay | Admitting: Nurse Practitioner

## 2024-11-17 ENCOUNTER — Ambulatory Visit: Admitting: Nurse Practitioner
